# Patient Record
Sex: Male | Born: 1940 | Race: White | Hispanic: No | Marital: Single | Smoking: Former smoker
Health system: Southern US, Community
[De-identification: ages and names within clinical notes are randomized; demographics above are authoritative.]

## PROBLEM LIST (undated history)

## (undated) DIAGNOSIS — I719 Aortic aneurysm of unspecified site, without rupture: Secondary | ICD-10-CM

## (undated) DIAGNOSIS — E039 Hypothyroidism, unspecified: Secondary | ICD-10-CM

## (undated) DIAGNOSIS — M1990 Unspecified osteoarthritis, unspecified site: Secondary | ICD-10-CM

## (undated) DIAGNOSIS — I2510 Atherosclerotic heart disease of native coronary artery without angina pectoris: Secondary | ICD-10-CM

## (undated) DIAGNOSIS — G4730 Sleep apnea, unspecified: Secondary | ICD-10-CM

## (undated) DIAGNOSIS — K219 Gastro-esophageal reflux disease without esophagitis: Secondary | ICD-10-CM

## (undated) HISTORY — PX: ABDOMINAL AORTIC ANEURYSM REPAIR: SUR1152

## (undated) HISTORY — PX: HERNIA REPAIR: SHX51

## (undated) HISTORY — PX: BACK SURGERY: SHX140

---

## 2014-01-03 ENCOUNTER — Other Ambulatory Visit: Payer: Self-pay | Admitting: Orthopedic Surgery

## 2014-01-04 ENCOUNTER — Encounter (HOSPITAL_COMMUNITY): Payer: Self-pay | Admitting: Pharmacy Technician

## 2014-01-07 ENCOUNTER — Encounter (HOSPITAL_COMMUNITY)
Admission: RE | Admit: 2014-01-07 | Discharge: 2014-01-07 | Disposition: A | Discharge status: Home / Self Care | Payer: Medicare Other | Source: Ambulatory Visit | Attending: Orthopedic Surgery | Admitting: Orthopedic Surgery

## 2014-01-07 ENCOUNTER — Encounter (HOSPITAL_COMMUNITY): Payer: Self-pay

## 2014-01-07 DIAGNOSIS — Z7982 Long term (current) use of aspirin: Secondary | ICD-10-CM | POA: Insufficient documentation

## 2014-01-07 DIAGNOSIS — E039 Hypothyroidism, unspecified: Secondary | ICD-10-CM | POA: Diagnosis not present

## 2014-01-07 DIAGNOSIS — Z9889 Other specified postprocedural states: Secondary | ICD-10-CM | POA: Diagnosis not present

## 2014-01-07 DIAGNOSIS — I714 Abdominal aortic aneurysm, without rupture, unspecified: Secondary | ICD-10-CM | POA: Insufficient documentation

## 2014-01-07 DIAGNOSIS — K219 Gastro-esophageal reflux disease without esophagitis: Secondary | ICD-10-CM | POA: Insufficient documentation

## 2014-01-07 DIAGNOSIS — G4733 Obstructive sleep apnea (adult) (pediatric): Secondary | ICD-10-CM | POA: Diagnosis not present

## 2014-01-07 DIAGNOSIS — I2510 Atherosclerotic heart disease of native coronary artery without angina pectoris: Secondary | ICD-10-CM | POA: Insufficient documentation

## 2014-01-07 DIAGNOSIS — Z01818 Encounter for other preprocedural examination: Secondary | ICD-10-CM | POA: Diagnosis present

## 2014-01-07 DIAGNOSIS — M169 Osteoarthritis of hip, unspecified: Secondary | ICD-10-CM | POA: Diagnosis present

## 2014-01-07 DIAGNOSIS — M1610 Unilateral primary osteoarthritis, unspecified hip: Secondary | ICD-10-CM | POA: Diagnosis not present

## 2014-01-07 DIAGNOSIS — Z79899 Other long term (current) drug therapy: Secondary | ICD-10-CM | POA: Diagnosis not present

## 2014-01-07 HISTORY — DX: Hypothyroidism, unspecified: E03.9

## 2014-01-07 HISTORY — DX: Unspecified osteoarthritis, unspecified site: M19.90

## 2014-01-07 HISTORY — DX: Atherosclerotic heart disease of native coronary artery without angina pectoris: I25.10

## 2014-01-07 HISTORY — DX: Gastro-esophageal reflux disease without esophagitis: K21.9

## 2014-01-07 HISTORY — DX: Sleep apnea, unspecified: G47.30

## 2014-01-07 HISTORY — DX: Aortic aneurysm of unspecified site, without rupture: I71.9

## 2014-01-07 LAB — CBC WITH DIFFERENTIAL/PLATELET
Basophils Absolute: 0.1 K/uL (ref 0.0–0.1)
Basophils Relative: 1 % (ref 0–1)
Eosinophils Absolute: 0.1 K/uL (ref 0.0–0.7)
Eosinophils Relative: 1 % (ref 0–5)
HCT: 41.9 % (ref 39.0–52.0)
Hemoglobin: 14 g/dL (ref 13.0–17.0)
Lymphocytes Relative: 26 % (ref 12–46)
Lymphs Abs: 2.1 K/uL (ref 0.7–4.0)
MCH: 32.9 pg (ref 26.0–34.0)
MCHC: 33.4 g/dL (ref 30.0–36.0)
MCV: 98.4 fL (ref 78.0–100.0)
Monocytes Absolute: 0.8 K/uL (ref 0.1–1.0)
Monocytes Relative: 10 % (ref 3–12)
Neutro Abs: 5.1 K/uL (ref 1.7–7.7)
Neutrophils Relative %: 62 % (ref 43–77)
Platelets: 231 K/uL (ref 150–400)
RBC: 4.26 MIL/uL (ref 4.22–5.81)
RDW: 13.7 % (ref 11.5–15.5)
WBC: 8.2 K/uL (ref 4.0–10.5)

## 2014-01-07 LAB — URINALYSIS, ROUTINE W REFLEX MICROSCOPIC
Bilirubin Urine: NEGATIVE
Glucose, UA: NEGATIVE mg/dL
HGB URINE DIPSTICK: NEGATIVE
KETONES UR: 15 mg/dL — AB
Nitrite: NEGATIVE
Protein, ur: NEGATIVE mg/dL
Specific Gravity, Urine: 1.021 (ref 1.005–1.030)
Urobilinogen, UA: 0.2 mg/dL (ref 0.0–1.0)
pH: 7 (ref 5.0–8.0)

## 2014-01-07 LAB — COMPREHENSIVE METABOLIC PANEL WITH GFR
ALT: 12 U/L (ref 0–53)
AST: 17 U/L (ref 0–37)
Albumin: 3.7 g/dL (ref 3.5–5.2)
Alkaline Phosphatase: 86 U/L (ref 39–117)
Anion gap: 10 (ref 5–15)
BUN: 10 mg/dL (ref 6–23)
CO2: 29 meq/L (ref 19–32)
Calcium: 9.7 mg/dL (ref 8.4–10.5)
Chloride: 104 meq/L (ref 96–112)
Creatinine, Ser: 1.07 mg/dL (ref 0.50–1.35)
GFR calc Af Amer: 79 mL/min — ABNORMAL LOW
GFR calc non Af Amer: 68 mL/min — ABNORMAL LOW
Glucose, Bld: 100 mg/dL — ABNORMAL HIGH (ref 70–99)
Potassium: 4.3 meq/L (ref 3.7–5.3)
Sodium: 143 meq/L (ref 137–147)
Total Bilirubin: 0.5 mg/dL (ref 0.3–1.2)
Total Protein: 6.9 g/dL (ref 6.0–8.3)

## 2014-01-07 LAB — PROTIME-INR
INR: 1.08 (ref 0.00–1.49)
Prothrombin Time: 14 s (ref 11.6–15.2)

## 2014-01-07 LAB — URINE MICROSCOPIC-ADD ON

## 2014-01-07 LAB — SURGICAL PCR SCREEN
MRSA, PCR: NEGATIVE
Staphylococcus aureus: NEGATIVE

## 2014-01-07 LAB — ABO/RH: ABO/RH(D): A NEG

## 2014-01-07 LAB — APTT: aPTT: 29 s (ref 24–37)

## 2014-01-07 NOTE — Progress Notes (Signed)
Req'd ekg, cardiac tests, office notes from dr Alphonsus Sias at [facility] [phone]

## 2014-01-07 NOTE — Pre-Procedure Instructions (Addendum)
Billy Byrd  01/07/2014   Your procedure is scheduled on:  01/14/14  Report to [facility] short stay admitting at 48 AM.  Call this number if you have problems the morning of surgery: [phone]   Remember:   Do not eat food or drink liquids after midnight.   Take these medicines the morning of surgery with A SIP OF WATER: nexium, levothyroxine    Take all meds as ordered until day of surgery except as instructed below or per dr     Billy Byrd all herbel meds, nsaids (aleve,naproxen,advil,ibuprofen) 5 days prior to surgery(01/09/14) including vitamins ,aspirin  ,fish oil      STOP plavix per dr   Billy Byrd not wear jewelry, make-up or nail polish.  Do not wear lotions, powders, or perfumes. You may wear deodorant.  Do not shave 48 hours prior to surgery. Men may shave face and neck.  Do not bring valuables to the hospital.  [facility] is not responsible                  for any belongings or valuables.               Contacts, dentures or bridgework may not be worn into surgery.  Leave suitcase in the car. After surgery it may be brought to your room.  For patients admitted to the hospital, discharge time is determined by your                treatment team.               Patients discharged the day of surgery will not be allowed to drive  home.  Name and phone number of your driver:   Special Instructions:  Special Instructions: [place] - Preparing for Surgery  Before surgery, you can play an important role.  Because skin is not sterile, your skin needs to be as free of germs as possible.  You can reduce the number of germs on you skin by washing with CHG (chlorahexidine gluconate) soap before surgery.  CHG is an antiseptic cleaner which kills germs and bonds with the skin to continue killing germs even after washing.  Please DO NOT use if you have an allergy to CHG or antibacterial soaps.  If your skin becomes reddened/irritated stop using the CHG and inform your nurse when you arrive at  Short Stay.  Do not shave (including legs and underarms) for at least 48 hours prior to the first CHG shower.  You may shave your face.  Please follow these instructions carefully:   1.  Shower with CHG Soap the night before surgery and the morning of Surgery.  2.  If you choose to wash your hair, wash your hair first as usual with your normal shampoo.  3.  After you shampoo, rinse your hair and body thoroughly to remove the Shampoo.  4.  Use CHG as you would any other liquid soap.  You can apply chg directly  to the skin and wash gently with scrungie or a clean washcloth.  5.  Apply the CHG Soap to your body ONLY FROM THE NECK DOWN.  Do not use on open wounds or open sores.  Avoid contact with your eyes ears, mouth and genitals (private parts).  Wash genitals (private parts)       with your normal soap.  6.  Wash thoroughly, paying special attention to the area where your surgery will be performed.  7.  Thoroughly rinse your  body with warm water from the neck down.  8.  DO NOT shower/wash with your normal soap after using and rinsing off the CHG Soap.  9.  Pat yourself dry with a clean towel.            10.  Wear clean pajamas.            11.  Place clean sheets on your bed the night of your first shower and do not sleep with pets.  Day of Surgery  Do not apply any lotions/deodorants the morning of surgery.  Please wear clean clothes to the hospital/surgery center.   Please read over the following fact sheets that you were given: Pain Booklet, Coughing and Deep Breathing, Blood Transfusion Information, Total Joint Packet, MRSA Information and Surgical Site Infection Prevention

## 2014-01-08 NOTE — Progress Notes (Signed)
Anesthesia Chart Review:  Pt is 73 year old male posted for R total hip replacement on 01/14/14 with Dr. Dorna Leitz.   PMH: CAD (s/p DES to RCA 2005), OSA, GERD, hypothyroidism, AAA with repair (2010).   Medications include plavix and ASA.   Cxr shows no active disease, possible chronic mild interstitial prominence.   Preoperative labs reviewed.    EKG pending from [place]. On 02/2013, on stress test, resting EKG shows sinus rhythm with normal conduction.   Cardiology note located in "care everywhere" 11/13/13 from pt's cardiologist Dr. Odette Horns notes pt had negative nuclear stress test in 02/2013, EF 68%, and states pt is cleared for hip replacement surgery. Pt instructed to stop plavix and ASA 5 days prior to surgery and to resume 2-3 days post-op.   If EKG not received from [facility] by DOS, get same day EKG prior to surgery.   If no changes, I anticipate pt can proceed with surgery as scheduled.   Willeen Cass, FNP-BC [facility]/Anesthesiology Phone: [phone] 01/08/2014 4:00 PM

## 2014-01-13 MED ORDER — CHLORHEXIDINE GLUCONATE 4 % EX LIQD
60.0000 mL | Freq: Once | CUTANEOUS | Status: DC
  Filled 2014-01-13: qty 60

## 2014-01-13 MED ORDER — CEFAZOLIN SODIUM-DEXTROSE 2-3 GM-% IV SOLR
2.0000 g | INTRAVENOUS | Status: AC
  Administered 2014-01-14: 2 g via INTRAVENOUS
  Filled 2014-01-13: qty 50

## 2014-01-14 ENCOUNTER — Encounter (HOSPITAL_COMMUNITY)
Admission: RE | Disposition: A | Discharge status: Home Health | Payer: Medicare Other | Source: Ambulatory Visit | Attending: Orthopedic Surgery

## 2014-01-14 ENCOUNTER — Inpatient Hospital Stay (HOSPITAL_COMMUNITY)
Admission: RE | Admit: 2014-01-14 | Discharge: 2014-01-16 | DRG: 470 | Disposition: A | Discharge status: Home Health | Payer: Medicare Other | Source: Ambulatory Visit | Attending: Orthopedic Surgery | Admitting: Orthopedic Surgery

## 2014-01-14 ENCOUNTER — Inpatient Hospital Stay (HOSPITAL_COMMUNITY): Payer: Medicare Other | Admitting: Certified Registered Nurse Anesthetist

## 2014-01-14 ENCOUNTER — Encounter (HOSPITAL_COMMUNITY): Payer: Self-pay | Admitting: *Deleted

## 2014-01-14 ENCOUNTER — Encounter (HOSPITAL_COMMUNITY): Payer: Medicare Other | Admitting: Emergency Medicine

## 2014-01-14 ENCOUNTER — Inpatient Hospital Stay (HOSPITAL_COMMUNITY): Payer: Medicare Other

## 2014-01-14 DIAGNOSIS — M169 Osteoarthritis of hip, unspecified: Principal | ICD-10-CM | POA: Diagnosis present

## 2014-01-14 DIAGNOSIS — Z7902 Long term (current) use of antithrombotics/antiplatelets: Secondary | ICD-10-CM | POA: Diagnosis not present

## 2014-01-14 DIAGNOSIS — E039 Hypothyroidism, unspecified: Secondary | ICD-10-CM | POA: Diagnosis not present

## 2014-01-14 DIAGNOSIS — Z79899 Other long term (current) drug therapy: Secondary | ICD-10-CM

## 2014-01-14 DIAGNOSIS — Z7982 Long term (current) use of aspirin: Secondary | ICD-10-CM

## 2014-01-14 DIAGNOSIS — K219 Gastro-esophageal reflux disease without esophagitis: Secondary | ICD-10-CM | POA: Diagnosis present

## 2014-01-14 DIAGNOSIS — M1610 Unilateral primary osteoarthritis, unspecified hip: Secondary | ICD-10-CM | POA: Diagnosis not present

## 2014-01-14 DIAGNOSIS — G4730 Sleep apnea, unspecified: Secondary | ICD-10-CM | POA: Diagnosis present

## 2014-01-14 DIAGNOSIS — M25559 Pain in unspecified hip: Secondary | ICD-10-CM | POA: Diagnosis present

## 2014-01-14 DIAGNOSIS — I2510 Atherosclerotic heart disease of native coronary artery without angina pectoris: Secondary | ICD-10-CM | POA: Diagnosis not present

## 2014-01-14 DIAGNOSIS — M1611 Unilateral primary osteoarthritis, right hip: Secondary | ICD-10-CM | POA: Diagnosis present

## 2014-01-14 DIAGNOSIS — Z87891 Personal history of nicotine dependence: Secondary | ICD-10-CM | POA: Diagnosis not present

## 2014-01-14 HISTORY — PX: TOTAL HIP ARTHROPLASTY: SHX124

## 2014-01-14 LAB — TYPE AND SCREEN
ABO/RH(D): A NEG
ABO/RH(D): A NEG
Antibody Screen: NEGATIVE
Antibody Screen: NEGATIVE

## 2014-01-14 SURGERY — ARTHROPLASTY, HIP, TOTAL, ANTERIOR APPROACH
Anesthesia: Monitor Anesthesia Care | Site: Hip | Laterality: Right

## 2014-01-14 MED ORDER — HYDROMORPHONE HCL PF 1 MG/ML IJ SOLN
0.5000 mg | INTRAMUSCULAR | Status: DC | PRN
  Administered 2014-01-14 – 2014-01-15 (×4): 1 mg via INTRAVENOUS
  Filled 2014-01-14 (×4): qty 1

## 2014-01-14 MED ORDER — METHOCARBAMOL 500 MG PO TABS
500.0000 mg | ORAL_TABLET | Freq: Four times a day (QID) | ORAL | Status: DC | PRN
  Administered 2014-01-14 – 2014-01-16 (×2): 500 mg via ORAL
  Filled 2014-01-14 (×2): qty 1

## 2014-01-14 MED ORDER — SODIUM CHLORIDE 0.9 % IV SOLN
2000.0000 mg | INTRAVENOUS | Status: DC | PRN
  Administered 2014-01-14: 2000 mg via INTRAVENOUS

## 2014-01-14 MED ORDER — FENTANYL CITRATE 0.05 MG/ML IJ SOLN
INTRAMUSCULAR | Status: AC
  Filled 2014-01-14: qty 5

## 2014-01-14 MED ORDER — 0.9 % SODIUM CHLORIDE (POUR BTL) OPTIME
TOPICAL | Status: DC | PRN
  Administered 2014-01-14: 1000 mL

## 2014-01-14 MED ORDER — METHOCARBAMOL 1000 MG/10ML IJ SOLN
500.0000 mg | Freq: Four times a day (QID) | INTRAVENOUS | Status: DC | PRN
  Filled 2014-01-14: qty 5

## 2014-01-14 MED ORDER — BISACODYL 5 MG PO TBEC
5.0000 mg | DELAYED_RELEASE_TABLET | Freq: Every day | ORAL | Status: DC | PRN
  Administered 2014-01-15: 5 mg via ORAL
  Filled 2014-01-14: qty 1

## 2014-01-14 MED ORDER — KETOROLAC TROMETHAMINE 15 MG/ML IJ SOLN
INTRAMUSCULAR | Status: AC
  Filled 2014-01-14: qty 1

## 2014-01-14 MED ORDER — POLYETHYLENE GLYCOL 3350 17 G PO PACK: 17.0000 g | PACK | Freq: Every day | ORAL | Status: DC | PRN

## 2014-01-14 MED ORDER — ARTIFICIAL TEARS OP OINT
TOPICAL_OINTMENT | OPHTHALMIC | Status: AC
  Filled 2014-01-14: qty 3.5

## 2014-01-14 MED ORDER — OXYCODONE-ACETAMINOPHEN 5-325 MG PO TABS: 1.0000 | ORAL_TABLET | Freq: Four times a day (QID) | ORAL | Status: DC | PRN

## 2014-01-14 MED ORDER — ACETAMINOPHEN 650 MG RE SUPP
650.0000 mg | Freq: Four times a day (QID) | RECTAL | Status: DC | PRN
Start: 2014-01-14 — End: 2014-01-16

## 2014-01-14 MED ORDER — OXYCODONE HCL 5 MG/5ML PO SOLN: 5.0000 mg | Freq: Once | ORAL | Status: DC | PRN

## 2014-01-14 MED ORDER — SODIUM CHLORIDE 0.9 % IV SOLN
INTRAVENOUS | Status: DC | PRN
  Administered 2014-01-14: 13:00:00 via INTRAVENOUS

## 2014-01-14 MED ORDER — SODIUM CHLORIDE 0.9 % IV SOLN
INTRAVENOUS | Status: DC
  Administered 2014-01-14: 18:00:00 via INTRAVENOUS

## 2014-01-14 MED ORDER — LEVOTHYROXINE SODIUM 150 MCG PO TABS
150.0000 ug | ORAL_TABLET | ORAL | Status: DC
  Administered 2014-01-15: 150 ug via ORAL
  Filled 2014-01-14 (×2): qty 1

## 2014-01-14 MED ORDER — BUPIVACAINE HCL (PF) 0.5 % IJ SOLN
INTRAMUSCULAR | Status: DC | PRN
  Administered 2014-01-14: 20 mL

## 2014-01-14 MED ORDER — ONDANSETRON HCL 4 MG/2ML IJ SOLN: 4.0000 mg | Freq: Four times a day (QID) | INTRAMUSCULAR | Status: DC | PRN

## 2014-01-14 MED ORDER — KETOROLAC TROMETHAMINE 15 MG/ML IJ SOLN
7.5000 mg | Freq: Four times a day (QID) | INTRAMUSCULAR | Status: AC
  Administered 2014-01-14 – 2014-01-15 (×4): 7.5 mg via INTRAVENOUS
  Filled 2014-01-14 (×2): qty 1

## 2014-01-14 MED ORDER — BUPIVACAINE IN DEXTROSE 0.75-8.25 % IT SOLN
INTRATHECAL | Status: DC | PRN
  Administered 2014-01-14: 12 mg via INTRATHECAL

## 2014-01-14 MED ORDER — LACTATED RINGERS IV SOLN
INTRAVENOUS | Status: DC
  Administered 2014-01-14: 11:00:00 via INTRAVENOUS

## 2014-01-14 MED ORDER — TRANEXAMIC ACID 100 MG/ML IV SOLN
2000.0000 mg | INTRAVENOUS | Status: DC
  Filled 2014-01-14: qty 20

## 2014-01-14 MED ORDER — ALUM & MAG HYDROXIDE-SIMETH 200-200-20 MG/5ML PO SUSP: 30.0000 mL | ORAL | Status: DC | PRN

## 2014-01-14 MED ORDER — LIDOCAINE HCL (CARDIAC) 20 MG/ML IV SOLN
INTRAVENOUS | Status: DC | PRN
Start: 2014-01-14 — End: 2014-01-14
  Administered 2014-01-14: 20 mg via INTRAVENOUS

## 2014-01-14 MED ORDER — PROPOFOL INFUSION 10 MG/ML OPTIME
INTRAVENOUS | Status: DC | PRN
  Administered 2014-01-14: 13:00:00 via INTRAVENOUS
  Administered 2014-01-14: 50 ug/kg/min via INTRAVENOUS

## 2014-01-14 MED ORDER — PROPOFOL 10 MG/ML IV BOLUS
INTRAVENOUS | Status: AC
  Filled 2014-01-14: qty 20

## 2014-01-14 MED ORDER — PROPOFOL 10 MG/ML IV BOLUS
INTRAVENOUS | Status: DC | PRN
  Administered 2014-01-14 (×2): 30 mg via INTRAVENOUS

## 2014-01-14 MED ORDER — LEVOTHYROXINE SODIUM 175 MCG PO TABS
175.0000 ug | ORAL_TABLET | ORAL | Status: DC
  Administered 2014-01-16: 175 ug via ORAL
  Filled 2014-01-14: qty 1

## 2014-01-14 MED ORDER — CLOPIDOGREL BISULFATE 75 MG PO TABS
75.0000 mg | ORAL_TABLET | Freq: Every day | ORAL | Status: DC
  Administered 2014-01-15 – 2014-01-16 (×2): 75 mg via ORAL
  Filled 2014-01-14 (×2): qty 1

## 2014-01-14 MED ORDER — BUPIVACAINE LIPOSOME 1.3 % IJ SUSP
INTRAMUSCULAR | Status: DC | PRN
  Administered 2014-01-14: 20 mL

## 2014-01-14 MED ORDER — ACETAMINOPHEN 325 MG PO TABS
650.0000 mg | ORAL_TABLET | Freq: Four times a day (QID) | ORAL | Status: DC | PRN
Start: 2014-01-14 — End: 2014-01-16

## 2014-01-14 MED ORDER — FENTANYL CITRATE 0.05 MG/ML IJ SOLN
INTRAMUSCULAR | Status: DC | PRN
  Administered 2014-01-14: 50 ug via INTRAVENOUS
  Administered 2014-01-14: 100 ug via INTRAVENOUS

## 2014-01-14 MED ORDER — MIDAZOLAM HCL 5 MG/5ML IJ SOLN
INTRAMUSCULAR | Status: DC | PRN
  Administered 2014-01-14: 2 mg via INTRAVENOUS

## 2014-01-14 MED ORDER — PROMETHAZINE HCL 25 MG/ML IJ SOLN: 12.5000 mg | Freq: Four times a day (QID) | INTRAMUSCULAR | Status: DC | PRN

## 2014-01-14 MED ORDER — ONDANSETRON HCL 4 MG/2ML IJ SOLN
INTRAMUSCULAR | Status: DC | PRN
  Administered 2014-01-14: 4 mg via INTRAVENOUS

## 2014-01-14 MED ORDER — ONDANSETRON HCL 4 MG PO TABS
4.0000 mg | ORAL_TABLET | Freq: Four times a day (QID) | ORAL | Status: DC | PRN
Start: 2014-01-14 — End: 2014-01-16

## 2014-01-14 MED ORDER — PROMETHAZINE HCL 25 MG/ML IJ SOLN: 6.2500 mg | INTRAMUSCULAR | Status: DC | PRN

## 2014-01-14 MED ORDER — DOCUSATE SODIUM 100 MG PO CAPS
100.0000 mg | ORAL_CAPSULE | Freq: Two times a day (BID) | ORAL | Status: DC
  Administered 2014-01-14 – 2014-01-16 (×4): 100 mg via ORAL
  Filled 2014-01-14 (×6): qty 1

## 2014-01-14 MED ORDER — OXYCODONE-ACETAMINOPHEN 5-325 MG PO TABS
1.0000 | ORAL_TABLET | ORAL | Status: DC | PRN
  Administered 2014-01-14 – 2014-01-16 (×8): 2 via ORAL
  Filled 2014-01-14 (×8): qty 2

## 2014-01-14 MED ORDER — CEFAZOLIN SODIUM-DEXTROSE 2-3 GM-% IV SOLR
2.0000 g | Freq: Four times a day (QID) | INTRAVENOUS | Status: AC
  Administered 2014-01-14 (×2): 2 g via INTRAVENOUS
  Filled 2014-01-14 (×2): qty 50

## 2014-01-14 MED ORDER — ASPIRIN EC 81 MG PO TBEC
81.0000 mg | DELAYED_RELEASE_TABLET | Freq: Two times a day (BID) | ORAL | Status: DC
  Administered 2014-01-14 – 2014-01-16 (×4): 81 mg via ORAL
  Filled 2014-01-14 (×10): qty 1

## 2014-01-14 MED ORDER — OXYCODONE HCL 5 MG PO TABS: 5.0000 mg | ORAL_TABLET | Freq: Once | ORAL | Status: DC | PRN

## 2014-01-14 MED ORDER — HYDROMORPHONE HCL PF 1 MG/ML IJ SOLN: 0.2500 mg | INTRAMUSCULAR | Status: DC | PRN

## 2014-01-14 MED ORDER — BUPIVACAINE LIPOSOME 1.3 % IJ SUSP
20.0000 mL | INTRAMUSCULAR | Status: DC
  Filled 2014-01-14: qty 20

## 2014-01-14 MED ORDER — ZOLPIDEM TARTRATE 5 MG PO TABS: 5.0000 mg | ORAL_TABLET | Freq: Every evening | ORAL | Status: DC | PRN

## 2014-01-14 MED ORDER — MIDAZOLAM HCL 2 MG/2ML IJ SOLN
INTRAMUSCULAR | Status: AC
  Filled 2014-01-14: qty 2

## 2014-01-14 MED ORDER — PHENYLEPHRINE HCL 10 MG/ML IJ SOLN
10.0000 mg | INTRAVENOUS | Status: DC | PRN
  Administered 2014-01-14: 20 ug/min via INTRAVENOUS

## 2014-01-14 MED ORDER — ONDANSETRON HCL 4 MG/2ML IJ SOLN
INTRAMUSCULAR | Status: AC
  Filled 2014-01-14: qty 2

## 2014-01-14 MED ORDER — DIPHENHYDRAMINE HCL 12.5 MG/5ML PO ELIX: 12.5000 mg | ORAL_SOLUTION | ORAL | Status: DC | PRN

## 2014-01-14 MED ORDER — PANTOPRAZOLE SODIUM 40 MG PO TBEC
80.0000 mg | DELAYED_RELEASE_TABLET | Freq: Every day | ORAL | Status: DC
  Administered 2014-01-15: 80 mg via ORAL
  Filled 2014-01-14: qty 2

## 2014-01-14 SURGICAL SUPPLY — 54 items
BENZOIN TINCTURE PRP APPL 2/3 (GAUZE/BANDAGES/DRESSINGS) ×3 IMPLANT
BLADE SAW SGTL 18X1.27X75 (BLADE) ×2 IMPLANT
BLADE SAW SGTL 18X1.27X75MM (BLADE) ×1
BLADE SURG ROTATE 9660 (MISCELLANEOUS) IMPLANT
BNDG COHESIVE 6X5 TAN STRL LF (GAUZE/BANDAGES/DRESSINGS) IMPLANT
BNDG GAUZE ELAST 4 BULKY (GAUZE/BANDAGES/DRESSINGS) IMPLANT
CAPT HIP PF MOP ×3 IMPLANT
CELLS DAT CNTRL 66122 CELL SVR (MISCELLANEOUS) ×1 IMPLANT
CLOSURE STERI-STRIP 1/2X4 (GAUZE/BANDAGES/DRESSINGS) ×1
CLSR STERI-STRIP ANTIMIC 1/2X4 (GAUZE/BANDAGES/DRESSINGS) ×2 IMPLANT
COVER SURGICAL LIGHT HANDLE (MISCELLANEOUS) ×3 IMPLANT
DRAPE C-ARM 42X72 X-RAY (DRAPES) ×3 IMPLANT
DRAPE STERI IOBAN 125X83 (DRAPES) ×3 IMPLANT
DRAPE U-SHAPE 47X51 STRL (DRAPES) ×9 IMPLANT
DRSG MEPILEX BORDER 4X8 (GAUZE/BANDAGES/DRESSINGS) ×3 IMPLANT
DURAPREP 26ML APPLICATOR (WOUND CARE) ×3 IMPLANT
ELECT BLADE 4.0 EZ CLEAN MEGAD (MISCELLANEOUS)
ELECT CAUTERY BLADE 6.4 (BLADE) ×3 IMPLANT
ELECT REM PT RETURN 9FT ADLT (ELECTROSURGICAL) ×3
ELECTRODE BLDE 4.0 EZ CLN MEGD (MISCELLANEOUS) IMPLANT
ELECTRODE REM PT RTRN 9FT ADLT (ELECTROSURGICAL) ×1 IMPLANT
GAUZE XEROFORM 1X8 LF (GAUZE/BANDAGES/DRESSINGS) IMPLANT
GLOVE BIOGEL PI IND STRL 8 (GLOVE) ×2 IMPLANT
GLOVE BIOGEL PI INDICATOR 8 (GLOVE) ×4
GLOVE ECLIPSE 7.5 STRL STRAW (GLOVE) ×6 IMPLANT
GOWN STRL REUS W/ TWL LRG LVL3 (GOWN DISPOSABLE) ×2 IMPLANT
GOWN STRL REUS W/ TWL XL LVL3 (GOWN DISPOSABLE) ×2 IMPLANT
GOWN STRL REUS W/TWL LRG LVL3 (GOWN DISPOSABLE) ×4
GOWN STRL REUS W/TWL XL LVL3 (GOWN DISPOSABLE) ×4
HOOD PEEL AWAY FACE SHEILD DIS (HOOD) ×6 IMPLANT
KIT BASIN OR (CUSTOM PROCEDURE TRAY) ×3 IMPLANT
KIT ROOM TURNOVER OR (KITS) ×3 IMPLANT
MANIFOLD NEPTUNE II (INSTRUMENTS) ×3 IMPLANT
NEEDLE 22X1 1/2 (OR ONLY) (NEEDLE) ×3 IMPLANT
NS IRRIG 1000ML POUR BTL (IV SOLUTION) ×3 IMPLANT
PACK TOTAL JOINT (CUSTOM PROCEDURE TRAY) ×3 IMPLANT
PAD ARMBOARD 7.5X6 YLW CONV (MISCELLANEOUS) ×6 IMPLANT
RTRCTR WOUND ALEXIS 18CM MED (MISCELLANEOUS) ×3
SPONGE LAP 18X18 X RAY DECT (DISPOSABLE) IMPLANT
STAPLER VISISTAT 35W (STAPLE) IMPLANT
SUT ETHIBOND NAB CT1 #1 30IN (SUTURE) ×6 IMPLANT
SUT MNCRL AB 3-0 PS2 18 (SUTURE) IMPLANT
SUT VIC AB 0 CT1 27 (SUTURE) ×2
SUT VIC AB 0 CT1 27XBRD ANBCTR (SUTURE) ×1 IMPLANT
SUT VIC AB 1 CT1 27 (SUTURE) ×8
SUT VIC AB 1 CT1 27XBRD ANBCTR (SUTURE) ×4 IMPLANT
SUT VIC AB 2-0 CT1 27 (SUTURE) ×2
SUT VIC AB 2-0 CT1 TAPERPNT 27 (SUTURE) ×1 IMPLANT
SYRINGE 60CC LL (MISCELLANEOUS) ×3 IMPLANT
SYRINGE IRR TOOMEY STRL 70CC (SYRINGE) ×3 IMPLANT
TOWEL OR 17X24 6PK STRL BLUE (TOWEL DISPOSABLE) ×3 IMPLANT
TOWEL OR 17X26 10 PK STRL BLUE (TOWEL DISPOSABLE) ×3 IMPLANT
TRAY FOLEY CATH 16FR SILVER (SET/KITS/TRAYS/PACK) ×3 IMPLANT
WATER STERILE IRR 1000ML POUR (IV SOLUTION) IMPLANT

## 2014-01-14 NOTE — H&P (Signed)
TOTAL HIP ADMISSION H&P  Patient is admitted for right total hip arthroplasty.  Subjective:  Chief Complaint: right hip pain  HPI: Billy Byrd, 73 y.o. male, has a history of pain and functional disability in the right hip(s) due to arthritis and patient has failed non-surgical conservative treatments for greater than 12 weeks to include supervised PT with diminished ADL's post treatment, use of assistive devices, weight reduction as appropriate and activity modification.  Onset of symptoms was gradual starting 3 years ago with gradually worsening course since that time.The patient noted no past surgery on the right hip(s).  Patient currently rates pain in the right hip at 8 out of 10 with activity. Patient has night pain, worsening of pain with activity and weight bearing, trendelenberg gait, pain that interfers with activities of daily living, pain with passive range of motion and crepitus. Patient has evidence of subchondral cysts, subchondral sclerosis and joint space narrowing by imaging studies. This condition presents safety issues increasing the risk of falls. This patient has had failure of all reasonable conservative care.  There is no current active infection.  There are no active problems to display for this patient.  Past Medical History  Diagnosis Date  . Coronary artery disease   . Aortic aneurysm     stent done 6 yrs ago  . Sleep apnea     cpap used since 04  . Hypothyroidism   . GERD (gastroesophageal reflux disease)   . Arthritis     Past Surgical History  Procedure Laterality Date  . Abdominal aortic aneurysm repair      6 yrs ago stent  . Back surgery  84,10  . Hernia repair      bilateral inguinal    Prescriptions prior to admission  Medication Sig Dispense Refill  . clopidogrel (PLAVIX) 75 MG tablet Take 75 mg by mouth daily.      . cyanocobalamin (,VITAMIN B-12,) 1000 MCG/ML injection Inject 1,000 mcg into the muscle every 30 (thirty) days. First of each  month      . esomeprazole (NEXIUM) 40 MG capsule Take 40 mg by mouth daily at 12 noon.      Marland Kitchen ibuprofen (ADVIL,MOTRIN) 200 MG tablet Take 800 mg by mouth every 6 (six) hours as needed.      Marland Kitchen levothyroxine (SYNTHROID, LEVOTHROID) 150 MCG tablet Take 150 mcg by mouth every other day. Take 150 mcg every other day, alternating with 175 mcg      . levothyroxine (SYNTHROID, LEVOTHROID) 175 MCG tablet Take 175 mcg by mouth every other day. Take 175 mcg every other day alternating with 150 mcg      . aspirin 81 MG tablet Take 81 mg by mouth daily.      . calcium carbonate 200 MG capsule Take 1,000 mg by mouth 1 day or 1 dose.      . Calcium Carbonate-Vitamin D (CALCIUM-VITAMIN D) 500-200 MG-UNIT per tablet Take 1 tablet by mouth daily.      . Fish Oil-Cholecalciferol (FISH OIL + D3) 1000-1000 MG-UNIT CAPS Take 1,000 Units by mouth 1 day or 1 dose.       No Known Allergies  History  Substance Use Topics  . Smoking status: Former Smoker -- 0.50 packs/day for 10 years    Types: Cigarettes    Quit date: 01/07/2010  . Smokeless tobacco: Not on file  . Alcohol Use: Yes     Comment: occ wine    History reviewed. No pertinent family history.  ROS ROS: I have reviewed the patient's review of systems thoroughly and there are no positive responses as relates to the HPI. Objective:  Physical Exam  Vital signs in last 24 hours: Temp:  [97.1 F (36.2 C)] 97.1 F (36.2 C) (08/24 1033) Pulse Rate:  [75] 75 (08/24 1033) Resp:  [18] 18 (08/24 1033) BP: (145)/(74) 145/74 mmHg (08/24 1033) SpO2:  [98 %] 98 % (08/24 1033) Weight:  [227 lb 15.3 oz (103.4 kg)] 227 lb 15.3 oz (103.4 kg) (08/24 1033) Well-developed well-nourished patient in no acute distress. Alert and oriented x3 HEENT:within normal limits Cardiac: Regular rate and rhythm Pulmonary: Lungs clear to auscultation Abdomen: Soft and nontender.  Normal active bowel sounds  Musculoskeletal: Right hip has limited range of motion.  There is  pain with internal/external rotation. Labs: Recent Results (from the past 2160 hour(s))  SURGICAL PCR SCREEN     Status: None   Collection Time    01/07/14 12:26 PM      Result Value Ref Range   MRSA, PCR NEGATIVE  NEGATIVE   Staphylococcus aureus NEGATIVE  NEGATIVE   Comment:            The Xpert SA Assay (FDA     approved for NASAL specimens     in patients over 74 years of age),     is one component of     a comprehensive surveillance     program.  Test performance has     been validated by [employer] for patients greater     than or equal to 12 year old.     It is not intended     to diagnose infection nor to     guide or monitor treatment.  APTT     Status: None   Collection Time    01/07/14 12:26 PM      Result Value Ref Range   aPTT 29  24 - 37 seconds  CBC WITH DIFFERENTIAL     Status: None   Collection Time    01/07/14 12:26 PM      Result Value Ref Range   WBC 8.2  4.0 - 10.5 K/uL   RBC 4.26  4.22 - 5.81 MIL/uL   Hemoglobin 14.0  13.0 - 17.0 g/dL   HCT 41.9  39.0 - 52.0 %   MCV 98.4  78.0 - 100.0 fL   MCH 32.9  26.0 - 34.0 pg   MCHC 33.4  30.0 - 36.0 g/dL   RDW 13.7  11.5 - 15.5 %   Platelets 231  150 - 400 K/uL   Neutrophils Relative % 62  43 - 77 %   Neutro Abs 5.1  1.7 - 7.7 K/uL   Lymphocytes Relative 26  12 - 46 %   Lymphs Abs 2.1  0.7 - 4.0 K/uL   Monocytes Relative 10  3 - 12 %   Monocytes Absolute 0.8  0.1 - 1.0 K/uL   Eosinophils Relative 1  0 - 5 %   Eosinophils Absolute 0.1  0.0 - 0.7 K/uL   Basophils Relative 1  0 - 1 %   Basophils Absolute 0.1  0.0 - 0.1 K/uL  COMPREHENSIVE METABOLIC PANEL     Status: Abnormal   Collection Time    01/07/14 12:26 PM      Result Value Ref Range   Sodium 143  137 - 147 mEq/L   Potassium 4.3  3.7 - 5.3 mEq/L   Chloride  104  96 - 112 mEq/L   CO2 29  19 - 32 mEq/L   Glucose, Bld 100 (*) 70 - 99 mg/dL   BUN 10  6 - 23 mg/dL   Creatinine, Ser 1.07  0.50 - 1.35 mg/dL   Calcium 9.7  8.4 - 10.5 mg/dL    Total Protein 6.9  6.0 - 8.3 g/dL   Albumin 3.7  3.5 - 5.2 g/dL   AST 17  0 - 37 U/L   ALT 12  0 - 53 U/L   Alkaline Phosphatase 86  39 - 117 U/L   Total Bilirubin 0.5  0.3 - 1.2 mg/dL   GFR calc non Af Amer 68 (*) >90 mL/min   GFR calc Af Amer 79 (*) >90 mL/min   Comment: (NOTE)     The eGFR has been calculated using the CKD EPI equation.     This calculation has not been validated in all clinical situations.     eGFR's persistently <90 mL/min signify possible Chronic Kidney     Disease.   Anion gap 10  5 - 15  PROTIME-INR     Status: None   Collection Time    01/07/14 12:26 PM      Result Value Ref Range   Prothrombin Time 14.0  11.6 - 15.2 seconds   INR 1.08  0.00 - 1.49  URINALYSIS, ROUTINE W REFLEX MICROSCOPIC     Status: Abnormal   Collection Time    01/07/14 12:26 PM      Result Value Ref Range   Color, Urine YELLOW  YELLOW   APPearance CLEAR  CLEAR   Specific Gravity, Urine 1.021  1.005 - 1.030   pH 7.0  5.0 - 8.0   Glucose, UA NEGATIVE  NEGATIVE mg/dL   Hgb urine dipstick NEGATIVE  NEGATIVE   Bilirubin Urine NEGATIVE  NEGATIVE   Ketones, ur 15 (*) NEGATIVE mg/dL   Protein, ur NEGATIVE  NEGATIVE mg/dL   Urobilinogen, UA 0.2  0.0 - 1.0 mg/dL   Nitrite NEGATIVE  NEGATIVE   Leukocytes, UA TRACE (*) NEGATIVE  URINE MICROSCOPIC-ADD ON     Status: Abnormal   Collection Time    01/07/14 12:26 PM      Result Value Ref Range   Squamous Epithelial / LPF FEW (*) RARE   WBC, UA 0-2  <3 WBC/hpf   Bacteria, UA RARE  RARE  TYPE AND SCREEN     Status: None   Collection Time    01/07/14 12:35 PM      Result Value Ref Range   ABO/RH(D) A NEG     Antibody Screen NEG     Sample Expiration 01/21/2014    ABO/RH     Status: None   Collection Time    01/07/14 12:35 PM      Result Value Ref Range   ABO/RH(D) A NEG      Estimated body mass index is 27.03 kg/(m^2) as calculated from the following:   Height as of this encounter: 6' 5" (1.956 m).   Weight as of this encounter:  227 lb 15.3 oz (103.4 kg).   Imaging Review Plain radiographs demonstrate severe degenerative joint disease of the right hip(s). The bone quality appears to be good for age and reported activity level.  Assessment/Plan:  End stage arthritis, right hip(s)  The patient history, physical examination, clinical judgement of the provider and imaging studies are consistent with end stage degenerative joint disease of the right hip(s) and total  hip arthroplasty is deemed medically necessary. The treatment options including medical management, injection therapy, arthroscopy and arthroplasty were discussed at length. The risks and benefits of total hip arthroplasty were presented and reviewed. The risks due to aseptic loosening, infection, stiffness, dislocation/subluxation,  thromboembolic complications and other imponderables were discussed.  The patient acknowledged the explanation, agreed to proceed with the plan and consent was signed. Patient is being admitted for inpatient treatment for surgery, pain control, PT, OT, prophylactic antibiotics, VTE prophylaxis, progressive ambulation and ADL's and discharge planning.The patient is planning to be discharged home with home health services

## 2014-01-14 NOTE — Discharge Instructions (Signed)
Total Hip Replacement, Care After °Refer to this sheet in the next few weeks. These instructions provide you with information on caring for yourself after your procedure. Your health care provider may also give you specific instructions. Your treatment has been planned according to the most current medical practices, but problems sometimes occur. Call your health care provider if you have any problems or questions after your procedure. °HOME CARE INSTRUCTIONS  °Your health care provider will give you specific precautions for certain types of movement. Additional instructions include: °· Take medicines only as directed by your health care provider. °· Take quick showers (3-5 min) rather than bathe until your health care provider tells you that you can take baths again. °· Avoid lifting until your health care provider instructs you otherwise. °· Use a raised toilet seat and avoid sitting in low chairs as instructed by your health care provider. °· Use crutches or a walker as instructed by your health care provider. °SEEK MEDICAL CARE IF: °· You have difficulty breathing. °· You have drainage, redness, or swelling at your incision site. °· You have a bad smell coming from your incision site. °· You have persistent bleeding from your incision site. °· Your incision breaks open after sutures (stitches) or staples have been removed. °· You have a fever. °SEEK IMMEDIATE MEDICAL CARE IF:  °· You have a rash. °· You have pain or swelling in your calf or thigh. °· You have shortness of breath or chest pain. °MAKE SURE YOU: °· Understand these instructions. °· Will watch your condition. °· Will get help if you are not doing well or get worse. °Document Released: 11/27/2004 Document Revised: 09/24/2013 Document Reviewed: 07/11/2013 °ExitCare® Patient Information ©2015 [employer]. This information is not intended to replace advice given to you by your health care provider. Make sure you discuss any questions you have with  your health care provider. ° °

## 2014-01-14 NOTE — Anesthesia Procedure Notes (Signed)
Spinal  Patient location during procedure: OR Start time: 01/14/2014 11:47 AM End time: 01/14/2014 11:58 AM Staffing Anesthesiologist: Duane Boston Performed by: anesthesiologist  Preanesthetic Checklist Completed: patient identified, surgical consent, pre-op evaluation, timeout performed, IV checked, risks and benefits discussed and monitors and equipment checked Spinal Block Patient position: sitting Prep: Betadine Patient monitoring: cardiac monitor, continuous pulse ox and blood pressure Approach: midline Injection technique: single-shot Needle Needle type: Quincke and Pencil-Tip  Needle gauge: 24 G Needle length: 9 cm Additional Notes Functioning IV was confirmed and monitors were applied. Sterile prep and drape, including hand hygiene and sterile gloves were used. The patient was positioned and the spine was prepped. The skin was anesthetized with lidocaine.  Free flow of clear CSF was obtained prior to injecting local anesthetic into the CSF.  The spinal needle aspirated freely following injection.  The needle was carefully withdrawn.  The patient tolerated the procedure well.

## 2014-01-14 NOTE — Plan of Care (Signed)
Problem: Consults Goal: Diagnosis- Total Joint Replacement Primary Total Hip Right     

## 2014-01-14 NOTE — Transfer of Care (Signed)
Immediate Anesthesia Transfer of Care Note  Patient: Billy Byrd  Procedure(s) Performed: Procedure(s): RIGHT TOTAL HIP ARTHROPLASTY ANTERIOR APPROACH (Right)  Patient Location: PACU  Anesthesia Type:MAC and Spinal  Level of Consciousness: awake, alert  and oriented  Airway & Oxygen Therapy: Patient Spontanous Breathing and Patient connected to face mask oxygen  Post-op Assessment: Report given to PACU RN and Post -op Vital signs reviewed and stable  Post vital signs: Reviewed and stable  Complications: No apparent anesthesia complications

## 2014-01-14 NOTE — Brief Op Note (Signed)
01/14/2014  2:33 PM  PATIENT:  Roselyn Bering  73 y.o. male  PRE-OPERATIVE DIAGNOSIS:  DEGENERATIVE JOINT DISEASE RIGHT HIP  POST-OPERATIVE DIAGNOSIS:  DEGENERATIVE JOINT DISEASE RIGHT HIP  PROCEDURE:  Procedure(s): RIGHT TOTAL HIP ARTHROPLASTY ANTERIOR APPROACH (Right)  SURGEON:  Surgeon(s) and Role:    * Alta Corning, MD - Primary  PHYSICIAN ASSISTANT:   ASSISTANTS: bethune   ANESTHESIA:   spinal  EBL:  Total I/O In: 1000 [I.V.:1000] Out: 650 [Urine:150; Blood:500]  BLOOD ADMINISTERED:none  DRAINS: none   LOCAL MEDICATIONS USED:  OTHER experel  SPECIMEN:  No Specimen  DISPOSITION OF SPECIMEN:  N/A  COUNTS:  YES  TOURNIQUET:  * No tourniquets in log *  DICTATION: .Other Dictation: Dictation Number [id number]  PLAN OF CARE: Admit to inpatient   PATIENT DISPOSITION:  PACU - hemodynamically stable.   Delay start of Pharmacological VTE agent (>24hrs) due to surgical blood loss or risk of bleeding: no

## 2014-01-14 NOTE — Anesthesia Preprocedure Evaluation (Signed)
Anesthesia Evaluation    Reviewed: Allergy & Precautions, H&P , NPO status , Patient's Chart, lab work & pertinent test results  History of Anesthesia Complications Negative for: history of anesthetic complications  Airway       Dental   Pulmonary sleep apnea and Continuous Positive Airway Pressure Ventilation , former smoker,          Cardiovascular + CAD and + Peripheral Vascular Disease     Neuro/Psych negative neurological ROS  negative psych ROS   GI/Hepatic Neg liver ROS, GERD-  Medicated,  Endo/Other  Hypothyroidism   Renal/GU negative Renal ROS     Musculoskeletal   Abdominal   Peds  Hematology   Anesthesia Other Findings   Reproductive/Obstetrics                           Anesthesia Physical Anesthesia Plan  ASA: III  Anesthesia Plan: MAC and Spinal   Post-op Pain Management:    Induction:   Airway Management Planned: Simple Face Mask  Additional Equipment:   Intra-op Plan:   Post-operative Plan:   Informed Consent:   Plan Discussed with: CRNA, Anesthesiologist and Surgeon  Anesthesia Plan Comments:         Anesthesia Quick Evaluation

## 2014-01-15 ENCOUNTER — Encounter (HOSPITAL_COMMUNITY): Payer: Self-pay | Admitting: Orthopedic Surgery

## 2014-01-15 LAB — BASIC METABOLIC PANEL
Anion gap: 8 (ref 5–15)
BUN: 9 mg/dL (ref 6–23)
CO2: 29 meq/L (ref 19–32)
Calcium: 8.4 mg/dL (ref 8.4–10.5)
Chloride: 101 mEq/L (ref 96–112)
Creatinine, Ser: 0.9 mg/dL (ref 0.50–1.35)
GFR calc Af Amer: >90 mL/min (ref >90)
GFR, EST NON AFRICAN AMERICAN: 84 mL/min — AB (ref >90)
GLUCOSE: 96 mg/dL (ref 70–99)
POTASSIUM: 4.2 meq/L (ref 3.7–5.3)
Sodium: 138 mEq/L (ref 137–147)

## 2014-01-15 LAB — CBC
HCT: 32.3 % — ABNORMAL LOW (ref 39.0–52.0)
HEMOGLOBIN: 11 g/dL — AB (ref 13.0–17.0)
MCH: 33 pg (ref 26.0–34.0)
MCHC: 34.1 g/dL (ref 30.0–36.0)
MCV: 97 fL (ref 78.0–100.0)
PLATELETS: 167 10*3/uL (ref 150–400)
RBC: 3.33 MIL/uL — ABNORMAL LOW (ref 4.22–5.81)
RDW: 13.9 % (ref 11.5–15.5)
WBC: 8 10*3/uL (ref 4.0–10.5)

## 2014-01-15 MED ORDER — BOOST PLUS PO LIQD
237.0000 mL | Freq: Two times a day (BID) | ORAL | Status: DC
  Administered 2014-01-15 – 2014-01-16 (×2): via ORAL
  Filled 2014-01-15 (×6): qty 237

## 2014-01-15 NOTE — Anesthesia Postprocedure Evaluation (Signed)
  Anesthesia Post-op Note  Patient: Billy Byrd  Procedure(s) Performed: Procedure(s): RIGHT TOTAL HIP ARTHROPLASTY ANTERIOR APPROACH (Right)  Patient Location: PACU  Anesthesia Type:Spinal  Level of Consciousness: awake, alert  and oriented  Airway and Oxygen Therapy: Patient Spontanous Breathing  Post-op Pain: none  Post-op Assessment: Post-op Vital signs reviewed, Patient's Cardiovascular Status Stable and Respiratory Function Stable  Post-op Vital Signs: Reviewed and stable  Last Vitals:  Filed Vitals:   01/15/14 0616  BP: 119/59  Pulse: 75  Temp: 36.5 C  Resp:     Complications: No apparent anesthesia complications

## 2014-01-15 NOTE — Care Management Note (Signed)
CARE MANAGEMENT NOTE 01/15/2014  Patient:  Billy Byrd, Billy Byrd   Account Number:  [id number]  Date Initiated:  01/15/2014  Documentation initiated by:  Ricki Miller  Subjective/Objective Assessment:   73 yr old male s/p right total hip anterior approach.     Action/Plan:   Case manager spoke with patient and significant other concerning home health and DME needs at discharge. Choice offered. Patient lives in [place], [place].   Anticipated DC Date:  01/16/2014   Anticipated DC Plan:  [place]  In-house referral  Clinical Social Worker      DC Planning Services  CM consult      [facility] - Inpatient Choice  HOME HEALTH  DURABLE MEDICAL EQUIPMENT   Choice offered to / List presented to:  C-1 Patient   DME arranged  [place]  3-N-1      DME agency  [place] arranged  [place]   Status of service:  In process, will continue to follow Medicare Important Message given?  NA - LOS <3 / Initial given by admissions (If response is "NO", the following Medicare IM given date fields will be blank) Date Medicare IM given:   Medicare IM given by:   Date Additional Medicare IM given:   Additional Medicare IM given by:    Discharge Disposition:  [place]  Per UR Regulation:  Reviewed for med. necessity/level of care/duration of stay  If discussed at [place] of Stay Meetings, dates discussed:    Comments:  01/15/14 10:30am Ricki Miller, RN BSN Case Manager Case manager contacted Billy Byrd at [facility] [phone]- with referral for home health physical therapy. Orders faxed to her at [phone]. Start of care will be Thursday 8/27 or Friday, 01/18/14 . Someone from [place] will contact Billy Byrd.

## 2014-01-15 NOTE — Evaluation (Signed)
Physical Therapy Evaluation Patient Details Name: Billy Byrd MRN: [id number] DOB: June 25, 1940 Today's Date: 01/15/2014   History of Present Illness  73 y.o. male s/p right total hip arthroplasty. Hx of CAD, aortic aneurysm, and GERD.  Clinical Impression  Pt is s/p right THA presenting with the deficits listed below (see PT Problem List). Ambulates quite well post op day #1, safely maintaining 50% weight-bearing status. Anticipate he will progress quickly towards physical therapy goals. Wife works near home and reports she will be checking-in on him periodically throughout the day when he returns home. Pt will benefit from skilled PT to increase their independence and safety with mobility to allow discharge to the venue listed below.       Follow Up Recommendations Home health PT;Supervision - Intermittent    Equipment Recommendations  3in1 (PT)    Recommendations for Other Services OT consult     Precautions / Restrictions Precautions Precautions: None Restrictions Weight Bearing Restrictions: Yes RLE Weight Bearing: Partial weight bearing RLE Partial Weight Bearing Percentage or Pounds: 50%      Mobility  Bed Mobility Overal bed mobility: Needs Assistance Bed Mobility: Supine to Sit     Supine to sit: Supervision     General bed mobility comments: Supervision for safety. VC for technique.  Transfers Overall transfer level: Needs assistance Equipment used: Rolling walker (2 wheeled) Transfers: Sit to/from Stand Sit to Stand: Min guard         General transfer comment: Min guard for safety. Performed from lowest bed setting x2. VC for hand placement. Good control with descent into chair.  Ambulation/Gait Ambulation/Gait assistance: Min guard Ambulation Distance (Feet): 80 Feet Assistive device: Rolling walker (2 wheeled) Gait Pattern/deviations: Step-to pattern;Decreased step length - right;Decreased step length - left;Decreased stance time - right;Antalgic    Gait velocity interpretation: Below normal speed for age/gender General Gait Details: Educated on safe DME use with rolling walker. Demonstrates ability to safely maintain PWB 50% on RLE with gait. VC for walker placement and sequencing of gait.  Stairs            Wheelchair Mobility    Modified Rankin (Stroke Patients Only)       Balance Overall balance assessment: Needs assistance Sitting-balance support: No upper extremity supported;Feet supported Sitting balance-Leahy Scale: Good     Standing balance support: No upper extremity supported Standing balance-Leahy Scale: Fair                               Pertinent Vitals/Pain Pain Assessment: 0-10 Pain Score: 4  Pain Intervention(s): Limited activity within patient's tolerance;Monitored during session;Repositioned    Home Living Family/patient expects to be discharged to:: Private residence Living Arrangements: Spouse/significant other Available Help at Discharge: Family;Available PRN/intermittently Type of Home: House Home Access: Stairs to enter Entrance Stairs-Rails: None Entrance Stairs-Number of Steps: 1 Home Layout: One level Home Equipment: Walker - 2 wheels;Shower seat - built in      Prior Function Level of Independence: Independent               Hand Dominance   Dominant Hand: Right    Extremity/Trunk Assessment   Upper Extremity Assessment: Defer to OT evaluation           Lower Extremity Assessment: RLE deficits/detail RLE Deficits / Details: Decreased strength and ROM as expected post op       Communication   Communication: HOH  Cognition Arousal/Alertness: Awake/alert Behavior During  Therapy: WFL for tasks assessed/performed Overall Cognitive Status: Within Functional Limits for tasks assessed                      General Comments      Exercises Total Joint Exercises Ankle Circles/Pumps: AROM;Both;10 reps;Supine Quad Sets: Both;10  reps;Supine;Strengthening Heel Slides: AAROM;Right;10 reps;Supine Hip ABduction/ADduction: AAROM;10 reps;Supine;Right      Assessment/Plan    PT Assessment Patient needs continued PT services  PT Diagnosis Difficulty walking;Abnormality of gait;Acute pain   PT Problem List Decreased strength;Decreased range of motion;Decreased activity tolerance;Decreased balance;Decreased mobility;Decreased knowledge of use of DME;Pain  PT Treatment Interventions DME instruction;Gait training;Stair training;Functional mobility training;Therapeutic activities;Therapeutic exercise;Balance training;Neuromuscular re-education;Patient/family education;Modalities   PT Goals (Current goals can be found in the Care Plan section) Acute Rehab PT Goals Patient Stated Goal: Go home PT Goal Formulation: With patient Time For Goal Achievement: 01/22/14 Potential to Achieve Goals: Good    Frequency 7X/week   Barriers to discharge Decreased caregiver support Pt will be alone 8 hours during the day - Wife reports she works close to home and will be able to check on him intermittently during the day.    Co-evaluation               End of Session   Activity Tolerance: Patient tolerated treatment well Patient left: in chair;with call bell/phone within reach;with family/visitor present Nurse Communication: Mobility status         Time: 0831-0908 PT Time Calculation (min): 37 min   Charges:   PT Evaluation $Initial PT Evaluation Tier I: 1 Procedure PT Treatments $Gait Training: 8-22 mins $Therapeutic Exercise: 8-22 mins   PT G Codes:        Elayne Snare, [place]   [name] 01/15/2014, 11:01 AM

## 2014-01-15 NOTE — Progress Notes (Signed)
Physical Therapy Treatment Patient Details Name: Billy Byrd MRN: [id number] DOB: 1941/03/31 Today's Date: 01/15/2014    History of Present Illness 73 y.o. male s/p right total hip arthroplasty. Hx of CAD, aortic aneurysm, and GERD.    PT Comments    Progressing well towards physical therapy goals. Safely completed stair training and is able to maintain 50% weight-bearing status with all mobility today. Will follow up in AM; feel he is adequate for d/c from a mobility standpoint when medically ready. Patient will continue to benefit from skilled physical therapy services at home with HHPT to further improve independence with functional mobility.   Follow Up Recommendations  Home health PT;Supervision - Intermittent     Equipment Recommendations  3in1 (PT)    Recommendations for Other Services OT consult     Precautions / Restrictions Precautions Precautions: None Restrictions Weight Bearing Restrictions: Yes RLE Weight Bearing: Partial weight bearing RLE Partial Weight Bearing Percentage or Pounds: 50%    Mobility  Bed Mobility Overal bed mobility: Modified Independent             General bed mobility comments: Requires extra time. Able to enter and exit bed without assist or cues.  Transfers Overall transfer level: Needs assistance Equipment used: Rolling walker (2 wheeled) Transfers: Sit to/from Stand Sit to Stand: Min guard         General transfer comment: Min guard for safety. VC for hand placement. Performed from lowest bed setting and tub bench. Good ability to maintain 50% WB precaution on RLE.  Ambulation/Gait Ambulation/Gait assistance: Min guard Ambulation Distance (Feet): 60 Feet (x2) Assistive device: Rolling walker (2 wheeled) Gait Pattern/deviations: Step-to pattern;Decreased step length - right;Decreased step length - left;Decreased stance time - right;Antalgic   Gait velocity interpretation: Below normal speed for age/gender General Gait  Details: VC for walker placement and forward gaze. Demonstrates ability to maintain 50% WB through RLE while ambulating. No loss of balance noted.   Stairs Stairs: Yes Stairs assistance: Min guard Stair Management: No rails;Backwards;With walker;Step to pattern Number of Stairs: 1 (x2) General stair comments: Educated on safe stair navigation. Demonstrated to patient prior to having him practice. Was able to safely perform and teach back correct sequencing. Wife present and actively participated in this activity.  Wheelchair Mobility    Modified Rankin (Stroke Patients Only)       Balance                                    Cognition Arousal/Alertness: Awake/alert Behavior During Therapy: WFL for tasks assessed/performed Overall Cognitive Status: Within Functional Limits for tasks assessed                      Exercises Total Joint Exercises Ankle Circles/Pumps: AROM;Both;10 reps;Supine Quad Sets: 10 reps;Supine;Strengthening;Right Heel Slides: Right;Supine;5 reps;AROM    General Comments        Pertinent Vitals/Pain Pain Assessment: 0-10 Pain Score: 9  Pain Location: Rt hip Pain Intervention(s): Limited activity within patient's tolerance;Monitored during session;Repositioned;Patient requesting pain meds-RN notified    Home Living                      Prior Function            PT Goals (current goals can now be found in the care plan section) Acute Rehab PT Goals PT Goal Formulation: With patient Time For Goal Achievement:  01/22/14 Potential to Achieve Goals: Good Progress towards PT goals: Progressing toward goals    Frequency  7X/week    PT Plan Current plan remains appropriate    Co-evaluation             End of Session   Activity Tolerance: Patient tolerated treatment well Patient left: with call bell/phone within reach;with family/visitor present;in bed     Time: 8850-2774 PT Time Calculation (min): 27  min  Charges:  $Gait Training: 8-22 mins $Therapeutic Activity: 8-22 mins                    G Codes:      [employer], [name]   [name] 01/15/2014, 4:54 PM

## 2014-01-15 NOTE — Progress Notes (Signed)
INITIAL NUTRITION ASSESSMENT  DOCUMENTATION CODES Per approved criteria  -Not Applicable   INTERVENTION: Provide Boost Plus po BID, each supplement contains 360 kcals and 14 grams of protein.   NUTRITION DIAGNOSIS: Increased nutrient needs related to s/p surgery as evidenced by estimated nutrition needs.   Goal: Pt to meet >/= 90% of their estimated nutrition needs   Monitor:  PO intake, weight trends, labs, I/O's  Reason for Assessment: MST  73 y.o. male  Admitting Dx: Osteoarthritis of right hip  ASSESSMENT: Pt with PMH of arthritis, CAD, and GERD. Pt presents with history of pain and functional disability in the right hip(s) due to arthritis and patient has failed non-surgical conservative treatments. Pt has evidence of subchondral cysts, subchondral sclerosis and joint space narrowing by imaging studies.  Procedure(8/24): RIGHT TOTAL HIP ARTHROPLASTY ANTERIOR APPROACH (Right)  Pt reports having a good appetite. Meal completion is 100%. Pt reports he also has been eating good at home with 3 full meals a day currently. Pt does report he has lost 40 lbs over a year due to stress and a decreased appetite from losing a loved one, however now he reports his appetite is fine. Pt reports his usual body weight of 267 lbs where he last weighed one year ago. Pt reports he has been drinking Boost at home once a day to help with weight loss. Pt requests that he would like Boost ordered BID while hospitalized. Will order. Pt denies any current nausea or stomach pains.  Height: Ht Readings from Last 1 Encounters:  01/14/14 6\' 5"  (1.956 m)    Weight: Wt Readings from Last 1 Encounters:  01/14/14 227 lb 15.3 oz (103.4 kg)    Ideal Body Weight: 208 lbs  % Ideal Body Weight: 109%  Wt Readings from Last 10 Encounters:  01/14/14 227 lb 15.3 oz (103.4 kg)  01/14/14 227 lb 15.3 oz (103.4 kg)  01/07/14 227 lb 15.3 oz (103.4 kg)   Usual Body Weight: 267 lbs (last weighed one year  ago)  % Usual Body Weight: 85%  BMI:  Body mass index is 27.03 kg/(m^2).  Estimated Nutritional Needs: Kcal: 2200-2400 Protein: 100-110 grams Fluid: 2.2 L - 2.4 L/day  Skin: right hip incision  Diet Order: General  EDUCATION NEEDS: -No education needs identified at this time   Intake/Output Summary (Last 24 hours) at 01/15/14 0944 Last data filed at 01/15/14 0655  Gross per 24 hour  Intake   2150 ml  Output   2050 ml  Net    100 ml    Last BM: 8/24   Labs:   Recent Labs Lab 01/15/14 0615  NA 138  K 4.2  CL 101  CO2 29  BUN 9  CREATININE 0.90  CALCIUM 8.4  GLUCOSE 96    CBG (last 3)  No results found for this basename: GLUCAP,  in the last 72 hours  Scheduled Meds: . aspirin EC  81 mg Oral BID PC  . clopidogrel  75 mg Oral Daily  . docusate sodium  100 mg Oral BID  . ketorolac  7.5 mg Intravenous 4 times per day  . levothyroxine  150 mcg Oral Q48H  . [START ON 01/16/2014] levothyroxine  175 mcg Oral Q48H  . pantoprazole  80 mg Oral Q1200    Continuous Infusions: . sodium chloride 75 mL/hr at 01/14/14 1811    Past Medical History  Diagnosis Date  . Coronary artery disease   . Aortic aneurysm     stent done  6 yrs ago  . Sleep apnea     cpap used since 04  . Hypothyroidism   . GERD (gastroesophageal reflux disease)   . Arthritis     Past Surgical History  Procedure Laterality Date  . Abdominal aortic aneurysm repair      6 yrs ago stent  . Back surgery  84,10  . Hernia repair      bilateral inguinal    Kallie Locks, MS, Provisional LDN Pager # [phone] After hours/ weekend pager # [phone]

## 2014-01-15 NOTE — Op Note (Signed)
NAMEHANNAN, HUTMACHER NO.:  [id number]  MEDICAL RECORD NO.:  [id number]  LOCATION:  [facility]                         FACILITY:  [facility]  PHYSICIAN:  Alta Corning, M.D.   DATE OF BIRTH:  10-02-1940  DATE OF PROCEDURE:  01/14/2014 DATE OF DISCHARGE:                              OPERATIVE REPORT   PREOPERATIVE DIAGNOSIS:  End-stage degenerative joint disease, right hip.  POSTOPERATIVE DIAGNOSIS:  End-stage degenerative joint disease, right hip.  PROCEDURE:  Right total hip replacement from an anterior approach with a size 14 carotid stem with a KLA high offset neck and a -1.5 hip ball.  SURGEON:  Alta Corning, M.D.  ASSISTANT:  Gary Fleet, P.A.  ANESTHESIA:  General.  BRIEF HISTORY:  Mr. Mroczkowski is a 73 year old male with a history and significant complaints of right hip pain.  We treated him conservatively for a period of time.  After a fairly well conservative care, an x-ray showing bone-on-bone change.  We felt that the appropriate course of action would be right total hip replacement, so he was brought to the operating room for this procedure.  DESCRIPTION OF PROCEDURE:  The patient was brought to the operating room.  After adequate anesthesia was obtained with general anesthetic, the patient was placed supine on the operating table.  The right hip was prepped and draped in usual sterile fashion.  The patient initially had been placed on the Hana bed and feet in traction boots.  Intraoperative x-rays were taken at this point showing a significantly narrowed hip on the right side and at this point, he was prepped and draped in usual sterile fashion and we made an anterior approach to the hip. Subcutaneous tissue down the level of the tensor fascia, it was divided in line with its fibers and retractors were put in place above and below the hip.  Once this was done, the capsule was opened and tagged and then a provisional neck cut was made under  fluoroscopic guidance.  Once that was done, the head ball was removed and the hip was externally rotated with a slight traction, retractors were put in place.  We sequentially reamed the acetabulum to a level of 59 and a 60 mm pinnacle cup with a porous coating was used.  A hole eliminator was used.  A 36 head ball neutral poly +4 was placed at this point, the final and attention at this time was turned towards the stem.  A retractor was put in place on the stem side and once this was done, the Hana bed was used to adduct the leg and elevate the femur and at this point, the femur was sequentially rasped to a level of 14.  This got excellent fit distally __________ we could get a little more proximally, but distally we were full and had good fixation at this point.  A trial standard ball was used, looked a little bit long at that point, I went to a KLA offset so I did think he had high offset to began with and KLA __________ offset gave [place] excellent neutral leg length.  At this time, the hip was put in 60 degrees of extension  and 100 of external rotation.  No tendency towards subluxation or dislocation.  At this point, the final KLA 14 with high offset was used, minus hip ball 36 mm was placed and the hip then reduced and the capsule was closed and the 40 mL Exparel were placed in and around the hip capsule as well as the musculature there, close the fascia, and then the skin. A sterile compressive dressing was applied.  The patient was taken to the recovery room in satisfactory condition.  Estimated blood loss for procedure was minimal.     Alta Corning, M.D.     Corliss Skains  D:  01/14/2014  T:  01/14/2014  Job:  [id number]

## 2014-01-15 NOTE — Care Management Utilization Note (Signed)
Utilization review completed. Mckynzi Cammon, RN BSN Case Manager 

## 2014-01-15 NOTE — Progress Notes (Signed)
Subjective: 1 Day Post-Op Procedure(s) (LRB): RIGHT TOTAL HIP ARTHROPLASTY ANTERIOR APPROACH (Right) Patient reports pain as moderate. Not out of bed yet.  Voiding well.  Taking by mouth well.   Objective: Vital signs in last 24 hours: Temp:  [95.1 F (35.1 C)-98.3 F (36.8 C)] 97.7 F (36.5 C) (08/25 0616) Pulse Rate:  [64-81] 81 (08/25 0840) Resp:  [9-18] 11 (08/24 1643) BP: (103-145)/(58-87) 119/59 mmHg (08/25 0616) SpO2:  [93 %-100 %] 97 % (08/25 0840) Weight:  [103.4 kg (227 lb 15.3 oz)] 103.4 kg (227 lb 15.3 oz) (08/24 1033)  Intake/Output from previous day: 08/24 0701 - 08/25 0700 In: 2150 [P.O.:240; I.V.:1910] Out: 2050 [Urine:1550; Blood:500] Intake/Output this shift:     Recent Labs  01/15/14 0615  HGB 11.0*    Recent Labs  01/15/14 0615  WBC 8.0  RBC 3.33*  HCT 32.3*  PLT 167    Recent Labs  01/15/14 0615  NA 138  K 4.2  CL 101  CO2 29  BUN 9  CREATININE 0.90  GLUCOSE 96  CALCIUM 8.4   Right hip exam: Neurovascular intact Sensation intact distally Intact pulses distally Dorsiflexion/Plantar flexion intact Incision: dressing C/D/I Compartment soft  Assessment/Plan: 1 Day Post-Op Procedure(s) (LRB): RIGHT TOTAL HIP ARTHROPLASTY ANTERIOR APPROACH (Right) Plan: Up with therapy Plan for discharge tomorrow with home health physical therapy. 50% weightbearing on right lower extremity without hip precautions. Aspirin/Plavix for DVT prophylaxis along with SCDs.  Leyland Kenna G 01/15/2014, 8:56 AM

## 2014-01-15 NOTE — Evaluation (Signed)
Occupational Therapy Evaluation Patient Details Name: Billy Byrd MRN: [id number] DOB: 12/07/1940 Today's Date: 01/15/2014    History of Present Illness 73 y.o. male s/p right total hip arthroplasty. Hx of CAD, aortic aneurysm, and GERD.   Clinical Impression   Pt admitted with the above diagnoses and presents with below problem list. Pt will benefit from continued acute OT to address the below listed deficits and maximize independence with basic ADLs prior to d/c home. PTA pt was independent with ADLs. Pt currently at min guard level for ADLs. Pt's wife works "3 minutes from home" and pt's sister will be assisting during the day. Pt maintaining PWB 50% status well during session.     Follow Up Recommendations  Supervision - Intermittent;No OT follow up    Equipment Recommendations  3 in 1 bedside comode    Recommendations for Other Services       Precautions / Restrictions Precautions Precautions: None Restrictions Weight Bearing Restrictions: Yes RLE Weight Bearing: Partial weight bearing RLE Partial Weight Bearing Percentage or Pounds: 50%      Mobility Bed Mobility Overal bed mobility: Needs Assistance Bed Mobility: Supine to Sit     Supine to sit: Supervision        Transfers Overall transfer level: Needs assistance Equipment used: Rolling walker (2 wheeled) Transfers: Sit to/from Stand Sit to Stand: Min guard         General transfer comment: good technique    Balance Overall balance assessment: Needs assistance Sitting-balance support: No upper extremity supported;Feet supported Sitting balance-Leahy Scale: Good     Standing balance support: Bilateral upper extremity supported;During functional activity Standing balance-Leahy Scale: Fair                              ADL Overall ADL's : Needs assistance/impaired Eating/Feeding: Set up;Sitting   Grooming: Set up;Sitting;Standing   Upper Body Bathing: Set up;Sitting   Lower Body  Bathing: Min guard;With adaptive equipment;Sit to/from stand   Upper Body Dressing : Set up;Sitting   Lower Body Dressing: Min guard;With adaptive equipment;Sit to/from stand   Toilet Transfer: Min guard;Ambulation;RW (3n1 over toilet)   Toileting- Clothing Manipulation and Hygiene: Min guard;With adaptive equipment;Sit to/from stand   Tub/ Shower Transfer: Min guard;Ambulation;3 in 1;Rolling walker   Functional mobility during ADLs: Min guard;Rolling walker General ADL Comments: Educated pt and spouse on techniques and AE for safe completion of ADLs.      Vision                     Perception     Praxis      Pertinent Vitals/Pain Pain Assessment: 0-10 Pain Score: 8  Pain Location: R hip Pain Descriptors / Indicators: Aching Pain Intervention(s): Limited activity within patient's tolerance;Monitored during session;Patient requesting pain meds-RN notified;Repositioned;RN gave pain meds during session     Hand Dominance Right   Extremity/Trunk Assessment Upper Extremity Assessment Upper Extremity Assessment: Overall WFL for tasks assessed   Lower Extremity Assessment Lower Extremity Assessment: Defer to PT evaluation       Communication Communication Communication: HOH   Cognition Arousal/Alertness: Awake/alert Behavior During Therapy: WFL for tasks assessed/performed Overall Cognitive Status: Within Functional Limits for tasks assessed                     General Comments       Exercises       Shoulder Instructions  Home Living Family/patient expects to be discharged to:: Private residence Living Arrangements: Spouse/significant other Available Help at Discharge: Family;Available PRN/intermittently Type of Home: House Home Access: Stairs to enter [employer] of Steps: 1 Entrance Stairs-Rails: None Home Layout: One level     Bathroom Shower/Tub: Teacher, early years/pre: Standard Bathroom Accessibility:  Yes How Accessible: Accessible via walker Home Equipment: [place] - 2 wheels;Shower seat - built in          Prior Functioning/Environment Level of Independence: Independent             OT Diagnosis: Acute pain   OT Problem List: Impaired balance (sitting and/or standing);Decreased knowledge of use of DME or AE;Decreased knowledge of precautions;Pain   OT Treatment/Interventions: Self-care/ADL training;Therapeutic exercise;DME and/or AE instruction;Therapeutic activities;Patient/family education;Balance training    OT Goals(Current goals can be found in the care plan section) Acute Rehab OT Goals Patient Stated Goal: "to golf in 3 weeks" OT Goal Formulation: With patient/family Time For Goal Achievement: 01/22/14 Potential to Achieve Goals: Good ADL Goals Pt Will Perform Lower Body Bathing: with modified independence;with adaptive equipment;sit to/from stand Pt Will Perform Lower Body Dressing: with modified independence;with adaptive equipment;sit to/from stand Pt Will Transfer to Toilet: with modified independence;ambulating (3n1 over toilet) Pt Will Perform Toileting - Clothing Manipulation and hygiene: with modified independence;with adaptive equipment;sit to/from stand Pt Will Perform Tub/Shower Transfer: with modified independence;ambulating;3 in 1;rolling walker  OT Frequency: Min 2X/week   Barriers to D/C:            Co-evaluation              End of Session Equipment Utilized During Treatment: Gait belt;Rolling walker Nurse Communication: Patient requests pain meds  Activity Tolerance: Patient tolerated treatment well;Patient limited by pain Patient left: in chair;with call bell/phone within reach;with family/visitor present   Time: 1132-1203 OT Time Calculation (min): 31 min Charges:  OT General Charges $OT Visit: 1 Procedure OT Evaluation $Initial OT Evaluation Tier I: 1 Procedure OT Treatments $Self Care/Home Management : 23-37 mins G-Codes:     Hortencia Pilar Jan 25, 2014, 12:14 PM

## 2014-01-16 LAB — CBC
HEMATOCRIT: 30.1 % — AB (ref 39.0–52.0)
Hemoglobin: 9.9 g/dL — ABNORMAL LOW (ref 13.0–17.0)
MCH: 31.6 pg (ref 26.0–34.0)
MCHC: 32.9 g/dL (ref 30.0–36.0)
MCV: 96.2 fL (ref 78.0–100.0)
PLATELETS: 155 10*3/uL (ref 150–400)
RBC: 3.13 MIL/uL — AB (ref 4.22–5.81)
RDW: 13.8 % (ref 11.5–15.5)
WBC: 9.1 10*3/uL (ref 4.0–10.5)

## 2014-01-16 NOTE — Discharge Summary (Signed)
Patient ID: Billy Byrd MRN: [id number] DOB/AGE: 1941/02/13 73 y.o.  Admit date: 01/14/2014 Discharge date: 01/16/2014  Admission Diagnoses:  Principal Problem:   Osteoarthritis of right hip   Discharge Diagnoses:  Same  Past Medical History  Diagnosis Date  . Coronary artery disease   . Aortic aneurysm     stent done 6 yrs ago  . Sleep apnea     cpap used since 04  . Hypothyroidism   . GERD (gastroesophageal reflux disease)   . Arthritis     Surgeries: Procedure(s): RIGHT TOTAL HIP ARTHROPLASTY ANTERIOR APPROACH on 01/14/2014    Discharged Condition: Improved  Hospital Course: Billy Byrd is an 73 y.o. male who was admitted 01/14/2014 for operative treatment ofOsteoarthritis of right hip. Patient has severe unremitting pain that affects sleep, daily activities, and work/hobbies. After pre-op clearance the patient was taken to the operating room on 01/14/2014 and underwent  Procedure(s): RIGHT TOTAL HIP ARTHROPLASTY ANTERIOR APPROACH.    Patient was given perioperative antibiotics:     Anti-infectives   Start     Dose/Rate Route Frequency Ordered Stop   01/14/14 1800  ceFAZolin (ANCEF) IVPB 2 Byrd/50 mL premix     2 Byrd 100 mL/hr over 30 Minutes Intravenous Every 6 hours 01/14/14 1723 01/15/14 0008   01/14/14 0600  ceFAZolin (ANCEF) IVPB 2 Byrd/50 mL premix     2 Byrd 100 mL/hr over 30 Minutes Intravenous On call to O.R. 01/13/14 1404 01/14/14 1208       Patient was given sequential compression devices, early ambulation, and chemoprophylaxis to prevent DVT.  Patient benefited maximally from hospital stay and there were no complications.    Recent vital signs:  Patient Vitals for the past 24 hrs:  BP Temp Temp src Pulse Resp SpO2  01/16/14 0430 107/55 mmHg 98 F (36.7 C) - 73 16 98 %  01/15/14 2004 127/57 mmHg 98.7 F (37.1 C) Oral 85 18 98 %  01/15/14 1450 99/41 mmHg 98.6 F (37 C) Oral 85 18 97 %     Recent laboratory studies:   Recent Labs  01/15/14 0615  01/16/14 0510  WBC 8.0 9.1  HGB 11.0* 9.9*  HCT 32.3* 30.1*  PLT 167 155  NA 138  --   K 4.2  --   CL 101  --   CO2 29  --   BUN 9  --   CREATININE 0.90  --   GLUCOSE 96  --   CALCIUM 8.4  --      Discharge Medications:     Medication List    STOP taking these medications       ibuprofen 200 MG tablet  Commonly known as:  ADVIL,MOTRIN      TAKE these medications       aspirin 81 MG tablet  Take 81 mg by mouth daily.     calcium carbonate 200 MG capsule  Take 1,000 mg by mouth 1 day or 1 dose.     calcium-vitamin D 500-200 MG-UNIT per tablet  Take 1 tablet by mouth daily.     clopidogrel 75 MG tablet  Commonly known as:  PLAVIX  Take 75 mg by mouth daily.     cyanocobalamin 1000 MCG/ML injection  Commonly known as:  (VITAMIN B-12)  Inject 1,000 mcg into the muscle every 30 (thirty) days. First of each month     esomeprazole 40 MG capsule  Commonly known as:  NEXIUM  Take 40 mg by mouth daily at 12 noon.  FISH OIL + D3 1000-1000 MG-UNIT Caps  Take 1,000 Units by mouth 1 day or 1 dose.     levothyroxine 175 MCG tablet  Commonly known as:  SYNTHROID, LEVOTHROID  Take 175 mcg by mouth every other day. Take 175 mcg every other day alternating with 150 mcg     levothyroxine 150 MCG tablet  Commonly known as:  SYNTHROID, LEVOTHROID  Take 150 mcg by mouth every other day. Take 150 mcg every other day, alternating with 175 mcg     oxyCODONE-acetaminophen 5-325 MG per tablet  Commonly known as:  PERCOCET/ROXICET  Take 1-2 tablets by mouth every 6 (six) hours as needed for severe pain.        Diagnostic Studies: Dg Chest 2 View  01/07/2014   CLINICAL DATA:  Preop for right hip arthroplasty, sleep apnea  EXAM: CHEST  2 VIEW  COMPARISON:  10/22/2013.  FINDINGS: Cardiomediastinal silhouette is unremarkable. Mild hyperinflation. Probable chronic mild interstitial prominence. No acute infiltrate or pulmonary edema.  IMPRESSION: No active disease.  Question  chronic mild interstitial prominence.   Electronically Signed   By: Lahoma Crocker M.D.   On: 01/07/2014 13:59   Dg Hip Operative Right  01/14/2014   CLINICAL DATA:  73 year old male undergoing right hip arthroplasty. Initial encounter.  EXAM: DG OPERATIVE RIGHT HIP  TECHNIQUE: A single spot fluoroscopic AP image of the right hip is submitted.  COMPARISON:  CT Abdomen and Pelvis 11/13/2013.  FLUOROSCOPY TIME:  0 min 15 seconds.  FINDINGS: 2 intraoperative fluoroscopic views of the right hip and lower pelvis. Right bipolar hip arthroplasty hardware. Chronic right inguinal surgical clips. Chronic penile prosthesis.  IMPRESSION: Bipolar right hip arthroplasty.   Electronically Signed   By: Lars Pinks M.D.   On: 01/14/2014 14:21   Dg Pelvis Portable  01/14/2014   CLINICAL DATA:  Postop right total hip arthroplasty.  EXAM: PORTABLE PELVIS 1-2 VIEWS  COMPARISON:  CT abdomen pelvis 11/13/2013  FINDINGS: Single AP view of the pelvis submitted. There are immediate postoperative changes of total right hip arthroplasty. Expected locules of gas are seen adjacent to the proximal right femur. No hardware complication or periprosthetic fracture is identified. Penile prosthesis noted. Bilateral common iliac artery stents are visualized. Surgical clips in the right groin.  IMPRESSION: Immediate postoperative changes of total right hip arthroplasty. No acute complicating feature identified.   Electronically Signed   By: Curlene Dolphin M.D.   On: 01/14/2014 15:27   Dg Hip Portable 1 View Right  01/14/2014   CLINICAL DATA:  Postop.  EXAM: PORTABLE RIGHT HIP - 1 VIEW  COMPARISON:  Earlier same day  FINDINGS: Examination demonstrates evidence of patient's right total hip arthroplasty. Acetabular and femoral prostheses are intact. There is 3 mm of lucency between most medial aspect of the proximal femoral stem of the prosthesis and adjacent bone unchanged. Penile prosthesis is present.  IMPRESSION: Right total hip arthroplasty as  described. Recommend correlation with findings at the time of the procedure.   Electronically Signed   By: Marin Olp M.D.   On: 01/14/2014 15:22    Disposition: Home with home health physical therapy  Discharge Instructions   Call MD / Call 911    Complete by:  As directed   If you experience chest pain or shortness of breath, CALL 911 and be transported to the hospital emergency room.  If you develope a fever above 101 F, pus (white drainage) or increased drainage or redness at the wound, or calf  pain, call your surgeon's office.     Constipation Prevention    Complete by:  As directed   Drink plenty of fluids.  Prune juice may be helpful.  You may use a stool softener, such as Colace (over the counter) 100 mg twice a day.  Use MiraLax (over the counter) for constipation as needed.     Diet general    Complete by:  As directed      Face-to-face encounter (required for Medicare/Medicaid patients)    Complete by:  As directed   I Billy Byrd certify that this patient is under my care and that I, or a nurse practitioner or physician's assistant working with me, had a face-to-face encounter that meets the physician face-to-face encounter requirements with this patient on 01/16/2014. The encounter with the patient was in whole, or in part for the following medical condition(s) which is the primary reason for home health care (List medical condition):  Osteoarthritis right hip status post anterior right total hip replacement  He will need home health physical therapy 3 times a week x2 weeks. 50% weightbearing on the right with a walker. No hip precautions. If any questions please call us at [phone]  The encounter with the patient was in whole, or in part, for the following medical condition, which is the primary reason for home health care:  Osteoarthritis right hip  I certify that, based on my findings, the following services are medically necessary home health services:  Physical therapy   My clinical findings support the need for the above services:  Unable to leave home safely without assistance and/or assistive device  Further, I certify that my clinical findings support that this patient is homebound due to:  Unable to leave home safely without assistance  Reason for Medically Necessary Home Health Services:  Therapy- Personnel officer, Public librarian     Home Health    Complete by:  As directed   To provide the following care/treatments:  PT     Increase activity slowly as tolerated    Complete by:  As directed      Partial weight bearing    Complete by:  As directed   % Body Weight:  50%  Laterality:  right  Extremity:  Lower  No hip precautions.     Partial weight bearing    Complete by:  As directed   % Body Weight:  50%  Laterality:  right  Extremity:  Lower           Follow-up Information   Follow up with [facility]. Schedule an appointment as soon as possible for a visit in 2 weeks.   Contact information:   [phone]      Follow up with GRAVES,JOHN L, MD. Schedule an appointment as soon as possible for a visit in 2 weeks.   Specialty:  Orthopedic Surgery   Contact information:   [place] [address] [phone]        Signed: Erlene Senters 01/16/2014, 11:06 AM

## 2014-01-16 NOTE — Progress Notes (Signed)
Note/chart reviewed.  Billy Byrd, RD, LDN Pager #: [phone] After-Hours Pager #: [phone]  

## 2014-01-16 NOTE — Progress Notes (Signed)
Subjective: 2 Days Post-Op Procedure(s) (LRB): RIGHT TOTAL HIP ARTHROPLASTY ANTERIOR APPROACH (Right) Patient reports pain as mild.  Taking by mouth and voiding okay. Positive flatus. Ready for discharge home.  Objective: Vital signs in last 24 hours: Temp:  [98 F (36.7 C)-98.7 F (37.1 C)] 98 F (36.7 C) (08/26 0430) Pulse Rate:  [73-85] 73 (08/26 0430) Resp:  [16-18] 16 (08/26 0430) BP: (99-127)/(41-57) 107/55 mmHg (08/26 0430) SpO2:  [97 %-98 %] 98 % (08/26 0430)  Intake/Output from previous day: 08/25 0701 - 08/26 0700 In: 2055.8 [P.O.:720; I.V.:1335.8] Out: 1200 [Urine:1200] Intake/Output this shift: Total I/O In: 360 [P.O.:360] Out: -    Recent Labs  01/15/14 0615 01/16/14 0510  HGB 11.0* 9.9*    Recent Labs  01/15/14 0615 01/16/14 0510  WBC 8.0 9.1  RBC 3.33* 3.13*  HCT 32.3* 30.1*  PLT 167 155    Recent Labs  01/15/14 0615  NA 138  K 4.2  CL 101  CO2 29  BUN 9  CREATININE 0.90  GLUCOSE 96  CALCIUM 8.4   Right hip exam Neurovascular intact Intact pulses distally Dorsiflexion/Plantar flexion intact Incision: dressing C/D/I Compartment soft  Assessment/Plan: 2 Days Post-Op Procedure(s) (LRB): RIGHT TOTAL HIP ARTHROPLASTY ANTERIOR APPROACH (Right) Plan: Will need home health physical therapy 3 times a week x2 weeks  50% weightbearing on the right hip with a walker. No hip precautions. Discharge home with home health Plavix and aspirin which he was on preoperatively for DVT prophylaxis. Followup with Dr. Berenice Primas in 2 weeks  [place] 01/16/2014, 10:54 AM

## 2014-01-16 NOTE — Progress Notes (Signed)
Physical Therapy Treatment Patient Details Name: Shashwat Cleary MRN: [id number] DOB: 09-20-1940 Today's Date: 01/16/2014    History of Present Illness 73 y.o. male s/p right total hip arthroplasty. Hx of CAD, aortic aneurysm, and GERD.    PT Comments    Patient continues to progress well towards physical therapy goals ambulating up to 100 feet with supervision while using a rolling walker. All education has been reviewed and he safely completed stair training yesterday. Pt reports he feels confident in his abilities and has no further questions concerning mobility at this time. Feel he is adequate for d/c from a PT standpoint. Patient will continue to benefit from skilled physical therapy services at home with HHPT to further improve independence with functional mobility.   Follow Up Recommendations  Home health PT;Supervision - Intermittent     Equipment Recommendations  3in1 (PT)    Recommendations for Other Services OT consult     Precautions / Restrictions Precautions Precautions: None Restrictions Weight Bearing Restrictions: Yes RLE Weight Bearing: Partial weight bearing RLE Partial Weight Bearing Percentage or Pounds: 50%    Mobility  Bed Mobility Overal bed mobility: Modified Independent                Transfers Overall transfer level: Needs assistance Equipment used: Rolling walker (2 wheeled) Transfers: Sit to/from Stand Sit to Stand: Min guard         General transfer comment: Min guard for safety. VC for hand placement. Mild instability upon standing this AM. Maintains weight bearing status.  Ambulation/Gait Ambulation/Gait assistance: Supervision Ambulation Distance (Feet): 100 Feet Assistive device: Rolling walker (2 wheeled) Gait Pattern/deviations: Step-to pattern;Decreased step length - left;Decreased stance time - right;Antalgic   Gait velocity interpretation: Below normal speed for age/gender General Gait Details: Safely follows weight  bearing status on RLE. VC for forward gaze and walker placement during turns. No loss of balance during ambulatory bout.   Stairs            Wheelchair Mobility    Modified Rankin (Stroke Patients Only)       Balance                                    Cognition Arousal/Alertness: Awake/alert Behavior During Therapy: WFL for tasks assessed/performed Overall Cognitive Status: Within Functional Limits for tasks assessed                      Exercises Total Joint Exercises Long Arc Quad: AROM;Right;10 reps;Seated    General Comments General comments (skin integrity, edema, etc.): Pt verbalizes understanding of therapeutic exercises and weight bearing status for RLE.      Pertinent Vitals/Pain Pain Assessment: 0-10 Pain Score:  ("feels less sore today") Pain Location: Rt hip Pain Intervention(s): Limited activity within patient's tolerance;Monitored during session;Premedicated before session;Repositioned    Home Living                      Prior Function            PT Goals (current goals can now be found in the care plan section) Acute Rehab PT Goals PT Goal Formulation: With patient Time For Goal Achievement: 01/22/14 Potential to Achieve Goals: Good Progress towards PT goals: Progressing toward goals    Frequency  7X/week    PT Plan Current plan remains appropriate    Co-evaluation  End of Session   Activity Tolerance: Patient tolerated treatment well Patient left: with call bell/phone within reach;with family/visitor present;in chair     Time: 1886-7737 PT Time Calculation (min): 13 min  Charges:  $Gait Training: 8-22 mins                    G Codes:      Elayne Snare, [place]  [name] 01/16/2014, 9:59 AM

## 2014-01-16 NOTE — Care Management Note (Signed)
CARE MANAGEMENT NOTE 01/16/2014  Patient:  Billy Byrd, Billy Byrd   Account Number:  [id number]  Date Initiated:  01/15/2014  Documentation initiated by:  Ricki Miller  Subjective/Objective Assessment:   73 yr old male s/p right total hip anterior approach.     Action/Plan:   Case manager spoke with patient and significant other concerning home health and DME needs at discharge. Choice offered. Patient lives in [place], [place].   Anticipated DC Date:  01/16/2014   Anticipated DC Plan:  [place]  In-house referral  Clinical Social Worker      DC Planning Services  CM consult      [facility] Choice  HOME HEALTH  DURABLE MEDICAL EQUIPMENT   Choice offered to / List presented to:  C-1 Patient   DME arranged  [place]  3-N-1      DME agency  [place] arranged  [place]      [facility] agency  [facility]   Status of service:  Completed, signed off Medicare Important Message given?  NA - LOS <3 / Initial given by admissions (If response is "NO", the following Medicare IM given date fields will be blank) Date Medicare IM given:   Medicare IM given by:   Date Additional Medicare IM given:   Additional Medicare IM given by:    Discharge Disposition:  [place]  Per UR Regulation:  Reviewed for med. necessity/level of care/duration of stay  If discussed at [place] of Stay Meetings, dates discussed:    Comments:  01/16/14 0930 Ricki Miller, RN BSN Case Manager Per Ladell Heads, RN case manager, patient was setup with [facility] and that is agency they want.This was not relayed to this Probation officer previously.  CM contacted [facility] and canceled Home Health.

## 2015-05-24 IMAGING — RF DG HIP OPERATIVE*R*
1 series · 4 of 4 positions shown · non-contrast
Comparison: CT Abdomen and Pelvis 11/13/2013.

FLUOROSCOPY TIME:  0 min 15 seconds.

CLINICAL DATA: 72-year-old male undergoing right hip arthroplasty.
Initial encounter.

EXAM:
DG OPERATIVE RIGHT HIP
TECHNIQUE: A single spot fluoroscopic AP image of the right hip is submitted.

[Series 1: run · 4 of 4 slices shown]
[im 1/4]
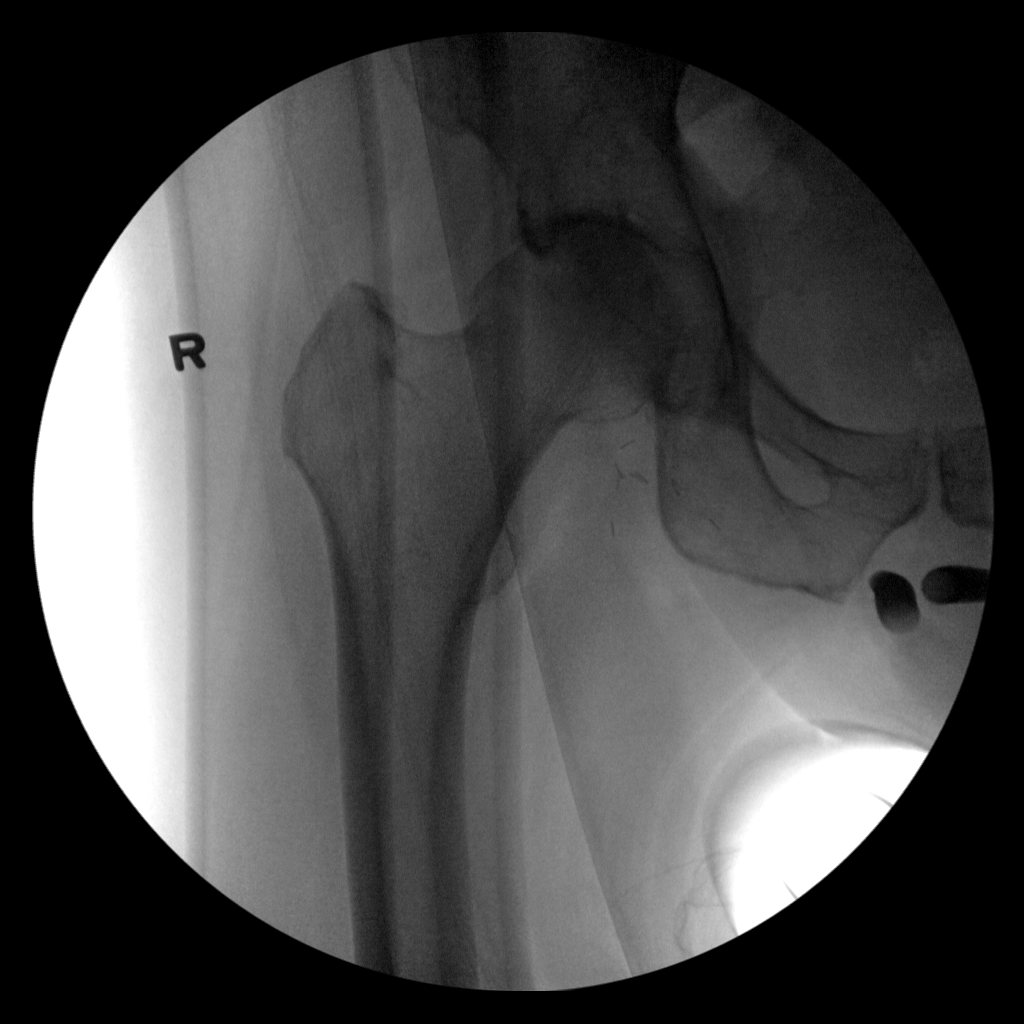
[im 2/4]
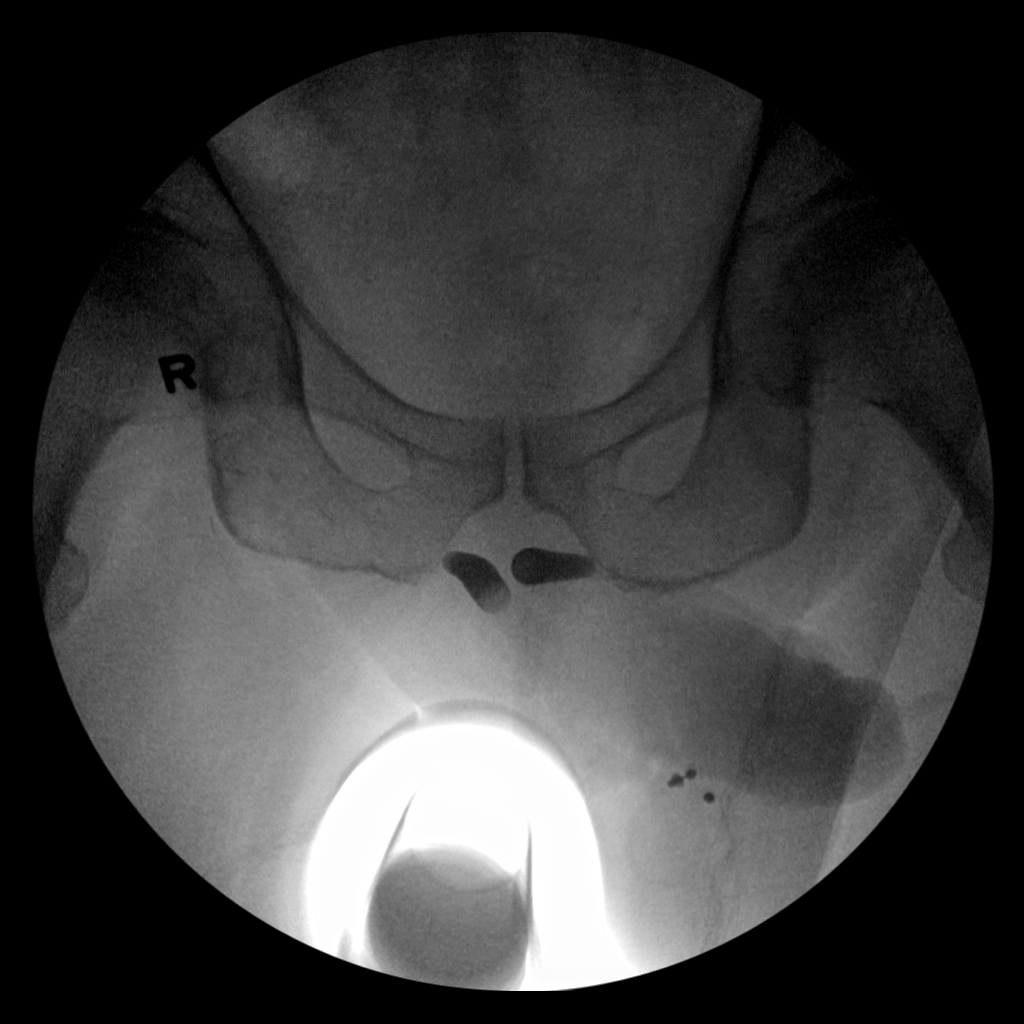
[im 3/4]
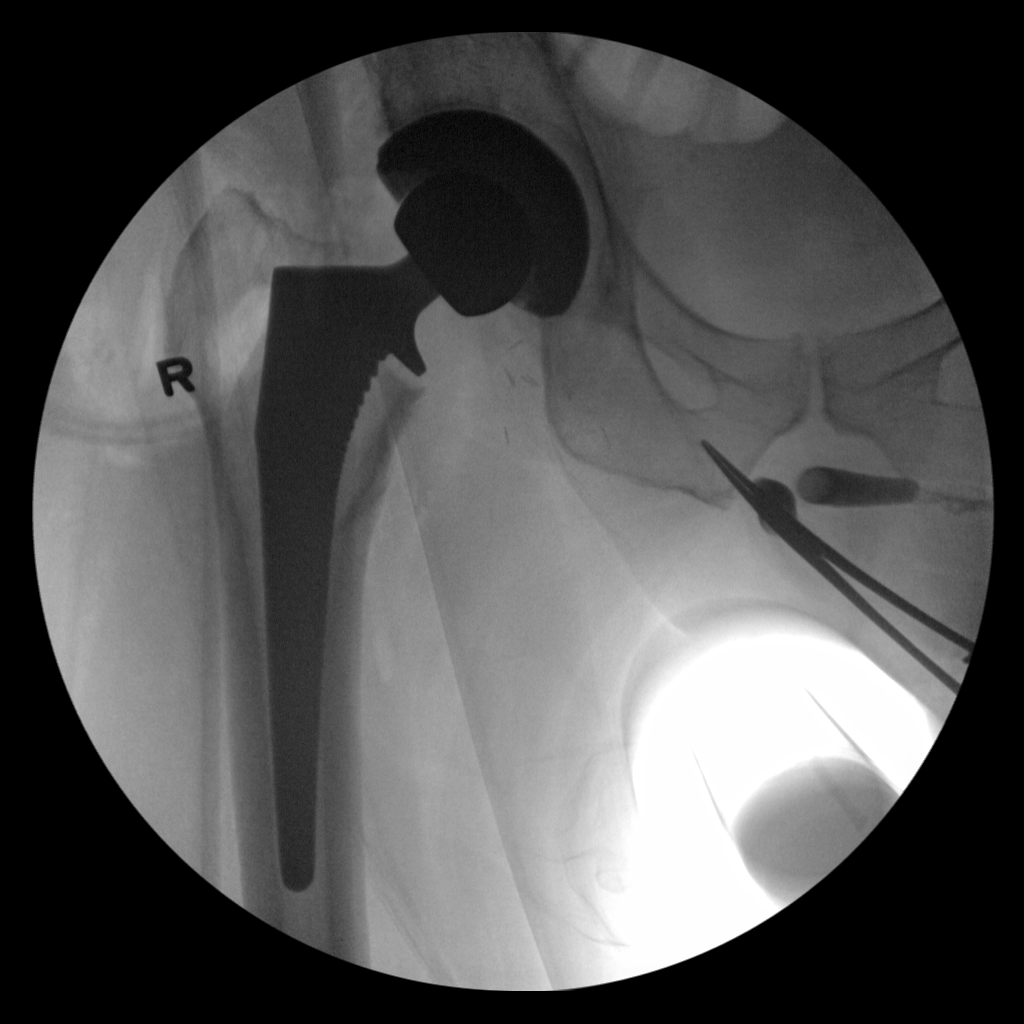
[im 4/4]
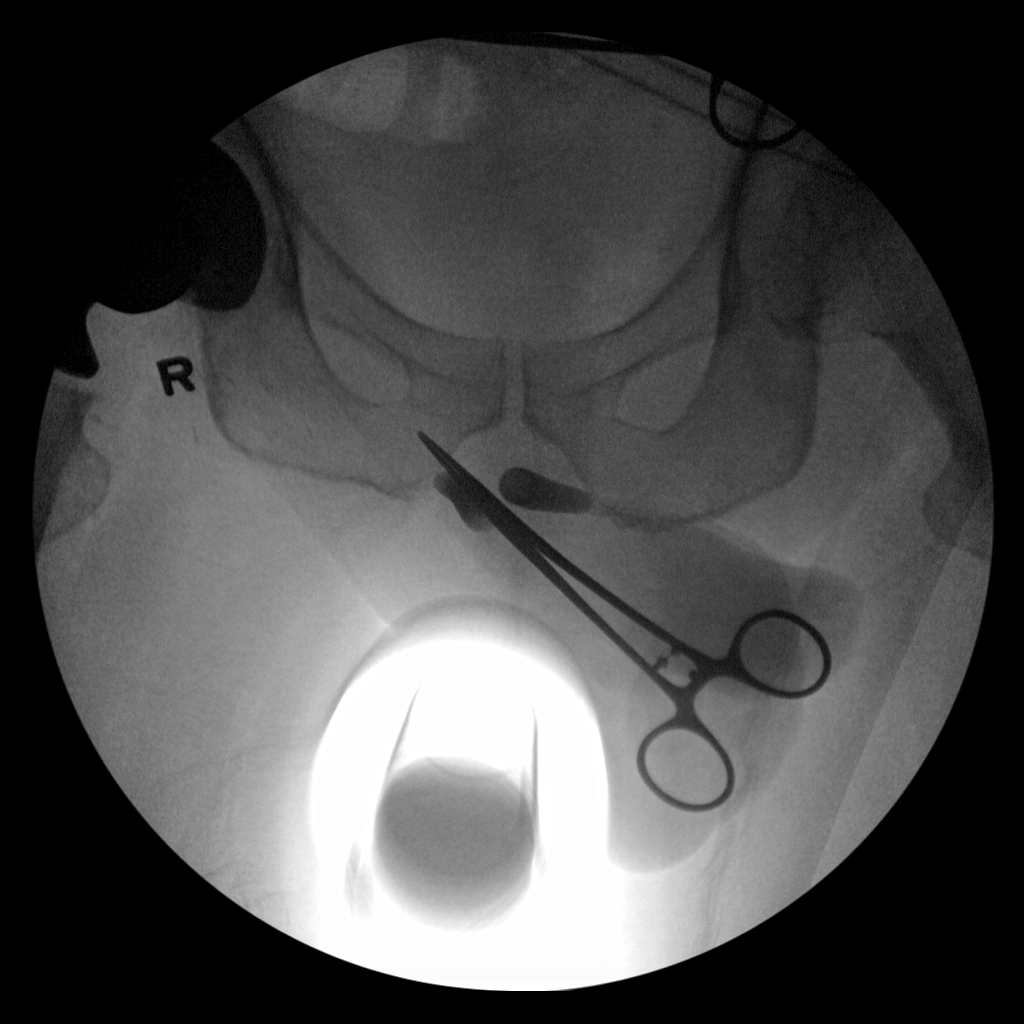

[4 of 4 positions shown; findings below may reference images not displayed]

FINDINGS: 2 intraoperative fluoroscopic views of the right hip and lower
pelvis. Right bipolar hip arthroplasty hardware. Chronic right
inguinal surgical clips. Chronic penile prosthesis.
IMPRESSION: Bipolar right hip arthroplasty.

## 2015-05-24 IMAGING — CR DG PORTABLE PELVIS
1 series · 1 of 1 positions shown · non-contrast
Comparison: CT abdomen pelvis 11/13/2013

CLINICAL DATA: Postop right total hip arthroplasty.

EXAM:
PORTABLE PELVIS 1-2 VIEWS

[AP]
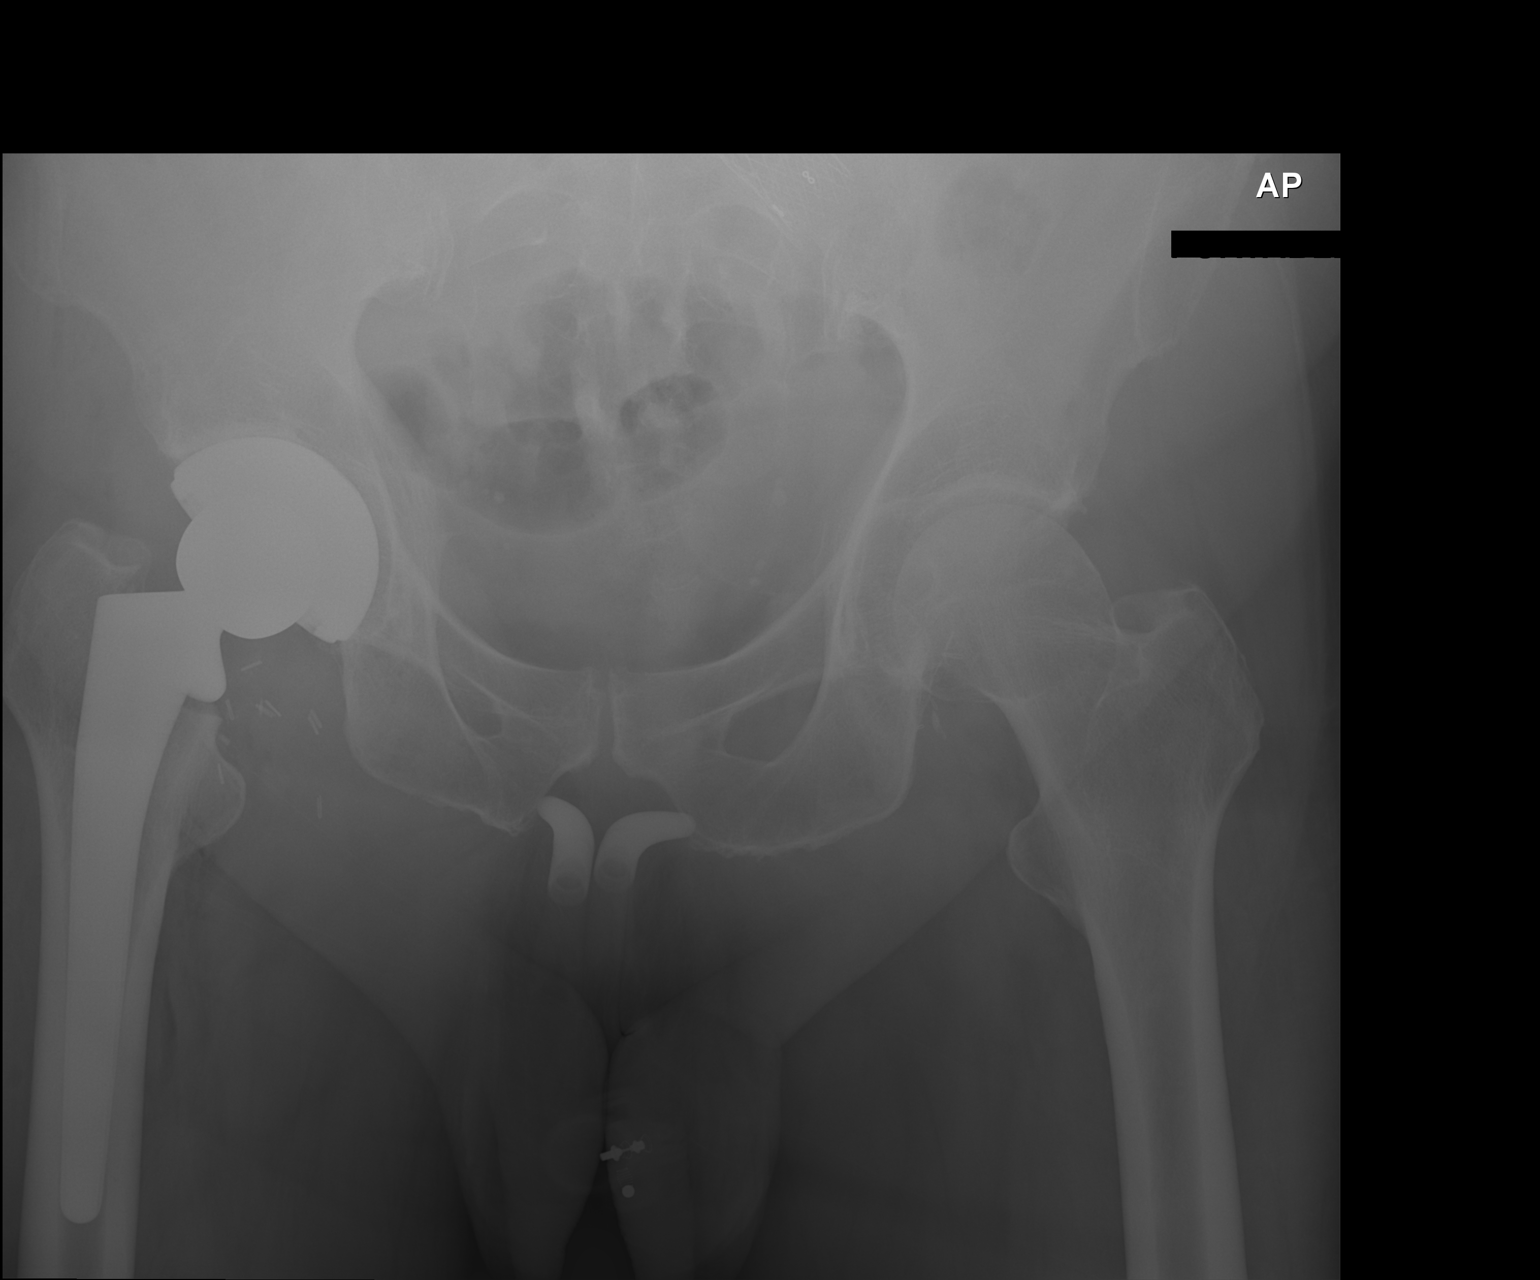

[1 of 1 positions shown; findings below may reference images not displayed]

FINDINGS: Single AP view of the pelvis submitted. There are immediate
postoperative changes of total right hip arthroplasty. Expected
locules of gas are seen adjacent to the proximal right femur. No
hardware complication or periprosthetic fracture is identified.
Penile prosthesis noted. Bilateral common iliac artery stents are
visualized. Surgical clips in the right groin.
IMPRESSION: Immediate postoperative changes of total right hip arthroplasty. No
acute complicating feature identified.

## 2015-06-05 DIAGNOSIS — G4700 Insomnia, unspecified: Secondary | ICD-10-CM | POA: Diagnosis not present

## 2015-06-05 DIAGNOSIS — M545 Low back pain: Secondary | ICD-10-CM | POA: Diagnosis not present

## 2015-06-05 DIAGNOSIS — F410 Panic disorder [episodic paroxysmal anxiety] without agoraphobia: Secondary | ICD-10-CM | POA: Diagnosis not present

## 2015-06-05 DIAGNOSIS — D039 Melanoma in situ, unspecified: Secondary | ICD-10-CM | POA: Diagnosis not present

## 2015-07-17 DIAGNOSIS — G4733 Obstructive sleep apnea (adult) (pediatric): Secondary | ICD-10-CM | POA: Diagnosis not present

## 2015-07-23 DIAGNOSIS — G4730 Sleep apnea, unspecified: Secondary | ICD-10-CM | POA: Diagnosis not present

## 2015-07-23 DIAGNOSIS — E7800 Pure hypercholesterolemia, unspecified: Secondary | ICD-10-CM | POA: Diagnosis not present

## 2015-07-23 DIAGNOSIS — Z87891 Personal history of nicotine dependence: Secondary | ICD-10-CM | POA: Diagnosis not present

## 2015-07-23 DIAGNOSIS — I2510 Atherosclerotic heart disease of native coronary artery without angina pectoris: Secondary | ICD-10-CM | POA: Diagnosis not present

## 2015-07-23 DIAGNOSIS — Z299 Encounter for prophylactic measures, unspecified: Secondary | ICD-10-CM | POA: Diagnosis not present

## 2015-07-23 DIAGNOSIS — M1990 Unspecified osteoarthritis, unspecified site: Secondary | ICD-10-CM | POA: Diagnosis not present

## 2015-07-31 DIAGNOSIS — E785 Hyperlipidemia, unspecified: Secondary | ICD-10-CM | POA: Diagnosis not present

## 2015-07-31 DIAGNOSIS — I2510 Atherosclerotic heart disease of native coronary artery without angina pectoris: Secondary | ICD-10-CM | POA: Diagnosis not present

## 2015-07-31 DIAGNOSIS — Z955 Presence of coronary angioplasty implant and graft: Secondary | ICD-10-CM | POA: Diagnosis not present

## 2015-07-31 DIAGNOSIS — Z9889 Other specified postprocedural states: Secondary | ICD-10-CM | POA: Diagnosis not present

## 2015-07-31 DIAGNOSIS — I340 Nonrheumatic mitral (valve) insufficiency: Secondary | ICD-10-CM | POA: Diagnosis not present

## 2015-08-05 DIAGNOSIS — Z48812 Encounter for surgical aftercare following surgery on the circulatory system: Secondary | ICD-10-CM | POA: Diagnosis not present

## 2015-08-05 DIAGNOSIS — I714 Abdominal aortic aneurysm, without rupture: Secondary | ICD-10-CM | POA: Diagnosis not present

## 2015-08-11 DIAGNOSIS — I2510 Atherosclerotic heart disease of native coronary artery without angina pectoris: Secondary | ICD-10-CM | POA: Diagnosis not present

## 2015-08-11 DIAGNOSIS — R0602 Shortness of breath: Secondary | ICD-10-CM | POA: Diagnosis not present

## 2015-08-24 DIAGNOSIS — S0990XA Unspecified injury of head, initial encounter: Secondary | ICD-10-CM | POA: Diagnosis not present

## 2015-09-01 DIAGNOSIS — I2510 Atherosclerotic heart disease of native coronary artery without angina pectoris: Secondary | ICD-10-CM | POA: Diagnosis not present

## 2015-09-01 DIAGNOSIS — M1990 Unspecified osteoarthritis, unspecified site: Secondary | ICD-10-CM | POA: Diagnosis not present

## 2015-09-01 DIAGNOSIS — M545 Low back pain: Secondary | ICD-10-CM | POA: Diagnosis not present

## 2015-09-11 DIAGNOSIS — M1990 Unspecified osteoarthritis, unspecified site: Secondary | ICD-10-CM | POA: Diagnosis not present

## 2015-09-11 DIAGNOSIS — S0990XA Unspecified injury of head, initial encounter: Secondary | ICD-10-CM | POA: Diagnosis not present

## 2015-09-11 DIAGNOSIS — S060X9A Concussion with loss of consciousness of unspecified duration, initial encounter: Secondary | ICD-10-CM | POA: Diagnosis not present

## 2015-09-11 DIAGNOSIS — S022XXA Fracture of nasal bones, initial encounter for closed fracture: Secondary | ICD-10-CM | POA: Diagnosis not present

## 2015-09-11 DIAGNOSIS — I2510 Atherosclerotic heart disease of native coronary artery without angina pectoris: Secondary | ICD-10-CM | POA: Diagnosis not present

## 2015-09-11 DIAGNOSIS — Z6824 Body mass index (BMI) 24.0-24.9, adult: Secondary | ICD-10-CM | POA: Diagnosis not present

## 2015-09-16 DIAGNOSIS — M159 Polyosteoarthritis, unspecified: Secondary | ICD-10-CM | POA: Diagnosis not present

## 2015-09-16 DIAGNOSIS — I2510 Atherosclerotic heart disease of native coronary artery without angina pectoris: Secondary | ICD-10-CM | POA: Diagnosis not present

## 2015-09-16 DIAGNOSIS — E7800 Pure hypercholesterolemia, unspecified: Secondary | ICD-10-CM | POA: Diagnosis not present

## 2015-12-10 DIAGNOSIS — Z1389 Encounter for screening for other disorder: Secondary | ICD-10-CM | POA: Diagnosis not present

## 2015-12-10 DIAGNOSIS — Z7189 Other specified counseling: Secondary | ICD-10-CM | POA: Diagnosis not present

## 2015-12-10 DIAGNOSIS — R5383 Other fatigue: Secondary | ICD-10-CM | POA: Diagnosis not present

## 2015-12-10 DIAGNOSIS — G4700 Insomnia, unspecified: Secondary | ICD-10-CM | POA: Diagnosis not present

## 2015-12-10 DIAGNOSIS — G4730 Sleep apnea, unspecified: Secondary | ICD-10-CM | POA: Diagnosis not present

## 2015-12-10 DIAGNOSIS — Z1211 Encounter for screening for malignant neoplasm of colon: Secondary | ICD-10-CM | POA: Diagnosis not present

## 2015-12-10 DIAGNOSIS — Z0000 Encounter for general adult medical examination without abnormal findings: Secondary | ICD-10-CM | POA: Diagnosis not present

## 2015-12-10 DIAGNOSIS — Z299 Encounter for prophylactic measures, unspecified: Secondary | ICD-10-CM | POA: Diagnosis not present

## 2015-12-10 DIAGNOSIS — E7800 Pure hypercholesterolemia, unspecified: Secondary | ICD-10-CM | POA: Diagnosis not present

## 2015-12-22 DIAGNOSIS — Z79899 Other long term (current) drug therapy: Secondary | ICD-10-CM | POA: Diagnosis not present

## 2015-12-22 DIAGNOSIS — R5383 Other fatigue: Secondary | ICD-10-CM | POA: Diagnosis not present

## 2015-12-22 DIAGNOSIS — G4730 Sleep apnea, unspecified: Secondary | ICD-10-CM | POA: Diagnosis not present

## 2015-12-22 DIAGNOSIS — Z125 Encounter for screening for malignant neoplasm of prostate: Secondary | ICD-10-CM | POA: Diagnosis not present

## 2015-12-22 DIAGNOSIS — E7800 Pure hypercholesterolemia, unspecified: Secondary | ICD-10-CM | POA: Diagnosis not present

## 2015-12-23 DIAGNOSIS — G4730 Sleep apnea, unspecified: Secondary | ICD-10-CM | POA: Diagnosis not present

## 2015-12-24 DIAGNOSIS — G4730 Sleep apnea, unspecified: Secondary | ICD-10-CM | POA: Diagnosis not present

## 2016-02-03 DIAGNOSIS — I714 Abdominal aortic aneurysm, without rupture: Secondary | ICD-10-CM | POA: Diagnosis not present

## 2016-02-03 DIAGNOSIS — Z48812 Encounter for surgical aftercare following surgery on the circulatory system: Secondary | ICD-10-CM | POA: Diagnosis not present

## 2016-03-11 DIAGNOSIS — F410 Panic disorder [episodic paroxysmal anxiety] without agoraphobia: Secondary | ICD-10-CM | POA: Diagnosis not present

## 2016-03-11 DIAGNOSIS — G4700 Insomnia, unspecified: Secondary | ICD-10-CM | POA: Diagnosis not present

## 2016-03-11 DIAGNOSIS — M545 Low back pain: Secondary | ICD-10-CM | POA: Diagnosis not present

## 2016-03-11 DIAGNOSIS — G4730 Sleep apnea, unspecified: Secondary | ICD-10-CM | POA: Diagnosis not present

## 2016-04-06 DIAGNOSIS — M159 Polyosteoarthritis, unspecified: Secondary | ICD-10-CM | POA: Diagnosis not present

## 2016-04-06 DIAGNOSIS — I2510 Atherosclerotic heart disease of native coronary artery without angina pectoris: Secondary | ICD-10-CM | POA: Diagnosis not present

## 2016-04-06 DIAGNOSIS — E7800 Pure hypercholesterolemia, unspecified: Secondary | ICD-10-CM | POA: Diagnosis not present

## 2016-05-21 DIAGNOSIS — M159 Polyosteoarthritis, unspecified: Secondary | ICD-10-CM | POA: Diagnosis not present

## 2016-05-21 DIAGNOSIS — I2510 Atherosclerotic heart disease of native coronary artery without angina pectoris: Secondary | ICD-10-CM | POA: Diagnosis not present

## 2016-05-21 DIAGNOSIS — E7800 Pure hypercholesterolemia, unspecified: Secondary | ICD-10-CM | POA: Diagnosis not present

## 2016-06-11 DIAGNOSIS — M549 Dorsalgia, unspecified: Secondary | ICD-10-CM | POA: Diagnosis not present

## 2016-06-11 DIAGNOSIS — G4700 Insomnia, unspecified: Secondary | ICD-10-CM | POA: Diagnosis not present

## 2016-07-12 DIAGNOSIS — M159 Polyosteoarthritis, unspecified: Secondary | ICD-10-CM | POA: Diagnosis not present

## 2016-07-12 DIAGNOSIS — E7800 Pure hypercholesterolemia, unspecified: Secondary | ICD-10-CM | POA: Diagnosis not present

## 2016-07-12 DIAGNOSIS — I2510 Atherosclerotic heart disease of native coronary artery without angina pectoris: Secondary | ICD-10-CM | POA: Diagnosis not present

## 2016-08-03 DIAGNOSIS — E7800 Pure hypercholesterolemia, unspecified: Secondary | ICD-10-CM | POA: Diagnosis not present

## 2016-08-03 DIAGNOSIS — M159 Polyosteoarthritis, unspecified: Secondary | ICD-10-CM | POA: Diagnosis not present

## 2016-08-03 DIAGNOSIS — I2510 Atherosclerotic heart disease of native coronary artery without angina pectoris: Secondary | ICD-10-CM | POA: Diagnosis not present

## 2016-08-09 DIAGNOSIS — G4730 Sleep apnea, unspecified: Secondary | ICD-10-CM | POA: Diagnosis not present

## 2016-08-09 DIAGNOSIS — F410 Panic disorder [episodic paroxysmal anxiety] without agoraphobia: Secondary | ICD-10-CM | POA: Diagnosis not present

## 2016-08-09 DIAGNOSIS — Z299 Encounter for prophylactic measures, unspecified: Secondary | ICD-10-CM | POA: Diagnosis not present

## 2016-08-09 DIAGNOSIS — E663 Overweight: Secondary | ICD-10-CM | POA: Diagnosis not present

## 2016-08-09 DIAGNOSIS — Z2821 Immunization not carried out because of patient refusal: Secondary | ICD-10-CM | POA: Diagnosis not present

## 2016-08-09 DIAGNOSIS — G4700 Insomnia, unspecified: Secondary | ICD-10-CM | POA: Diagnosis not present

## 2016-08-09 DIAGNOSIS — E039 Hypothyroidism, unspecified: Secondary | ICD-10-CM | POA: Diagnosis not present

## 2016-08-09 DIAGNOSIS — Z713 Dietary counseling and surveillance: Secondary | ICD-10-CM | POA: Diagnosis not present

## 2016-09-02 DIAGNOSIS — E7800 Pure hypercholesterolemia, unspecified: Secondary | ICD-10-CM | POA: Diagnosis not present

## 2016-09-02 DIAGNOSIS — H52209 Unspecified astigmatism, unspecified eye: Secondary | ICD-10-CM | POA: Diagnosis not present

## 2016-09-02 DIAGNOSIS — H2513 Age-related nuclear cataract, bilateral: Secondary | ICD-10-CM | POA: Diagnosis not present

## 2016-09-02 DIAGNOSIS — M159 Polyosteoarthritis, unspecified: Secondary | ICD-10-CM | POA: Diagnosis not present

## 2016-09-02 DIAGNOSIS — H353111 Nonexudative age-related macular degeneration, right eye, early dry stage: Secondary | ICD-10-CM | POA: Diagnosis not present

## 2016-09-02 DIAGNOSIS — I2510 Atherosclerotic heart disease of native coronary artery without angina pectoris: Secondary | ICD-10-CM | POA: Diagnosis not present

## 2016-09-02 DIAGNOSIS — H353122 Nonexudative age-related macular degeneration, left eye, intermediate dry stage: Secondary | ICD-10-CM | POA: Diagnosis not present

## 2016-09-22 DIAGNOSIS — E7800 Pure hypercholesterolemia, unspecified: Secondary | ICD-10-CM | POA: Diagnosis not present

## 2016-09-22 DIAGNOSIS — M159 Polyosteoarthritis, unspecified: Secondary | ICD-10-CM | POA: Diagnosis not present

## 2016-09-22 DIAGNOSIS — I2510 Atherosclerotic heart disease of native coronary artery without angina pectoris: Secondary | ICD-10-CM | POA: Diagnosis not present

## 2016-09-29 DIAGNOSIS — H353132 Nonexudative age-related macular degeneration, bilateral, intermediate dry stage: Secondary | ICD-10-CM | POA: Diagnosis not present

## 2016-09-29 DIAGNOSIS — H2513 Age-related nuclear cataract, bilateral: Secondary | ICD-10-CM | POA: Diagnosis not present

## 2016-11-09 DIAGNOSIS — I2510 Atherosclerotic heart disease of native coronary artery without angina pectoris: Secondary | ICD-10-CM | POA: Diagnosis not present

## 2016-11-09 DIAGNOSIS — E039 Hypothyroidism, unspecified: Secondary | ICD-10-CM | POA: Diagnosis not present

## 2016-11-09 DIAGNOSIS — Z299 Encounter for prophylactic measures, unspecified: Secondary | ICD-10-CM | POA: Diagnosis not present

## 2016-11-09 DIAGNOSIS — F410 Panic disorder [episodic paroxysmal anxiety] without agoraphobia: Secondary | ICD-10-CM | POA: Diagnosis not present

## 2016-11-09 DIAGNOSIS — G4730 Sleep apnea, unspecified: Secondary | ICD-10-CM | POA: Diagnosis not present

## 2016-11-09 DIAGNOSIS — Z6826 Body mass index (BMI) 26.0-26.9, adult: Secondary | ICD-10-CM | POA: Diagnosis not present

## 2016-11-16 DIAGNOSIS — H2511 Age-related nuclear cataract, right eye: Secondary | ICD-10-CM | POA: Diagnosis not present

## 2016-11-16 DIAGNOSIS — H269 Unspecified cataract: Secondary | ICD-10-CM | POA: Diagnosis not present

## 2016-11-17 DIAGNOSIS — H2512 Age-related nuclear cataract, left eye: Secondary | ICD-10-CM | POA: Diagnosis not present

## 2016-12-01 DIAGNOSIS — H269 Unspecified cataract: Secondary | ICD-10-CM | POA: Diagnosis not present

## 2016-12-01 DIAGNOSIS — H2512 Age-related nuclear cataract, left eye: Secondary | ICD-10-CM | POA: Diagnosis not present

## 2016-12-14 DIAGNOSIS — F410 Panic disorder [episodic paroxysmal anxiety] without agoraphobia: Secondary | ICD-10-CM | POA: Diagnosis not present

## 2016-12-14 DIAGNOSIS — Z1389 Encounter for screening for other disorder: Secondary | ICD-10-CM | POA: Diagnosis not present

## 2016-12-14 DIAGNOSIS — K219 Gastro-esophageal reflux disease without esophagitis: Secondary | ICD-10-CM | POA: Diagnosis not present

## 2016-12-14 DIAGNOSIS — Z6826 Body mass index (BMI) 26.0-26.9, adult: Secondary | ICD-10-CM | POA: Diagnosis not present

## 2016-12-14 DIAGNOSIS — Z299 Encounter for prophylactic measures, unspecified: Secondary | ICD-10-CM | POA: Diagnosis not present

## 2016-12-14 DIAGNOSIS — Z7189 Other specified counseling: Secondary | ICD-10-CM | POA: Diagnosis not present

## 2016-12-14 DIAGNOSIS — G4700 Insomnia, unspecified: Secondary | ICD-10-CM | POA: Diagnosis not present

## 2016-12-14 DIAGNOSIS — Z1211 Encounter for screening for malignant neoplasm of colon: Secondary | ICD-10-CM | POA: Diagnosis not present

## 2016-12-14 DIAGNOSIS — I2510 Atherosclerotic heart disease of native coronary artery without angina pectoris: Secondary | ICD-10-CM | POA: Diagnosis not present

## 2016-12-14 DIAGNOSIS — Z0000 Encounter for general adult medical examination without abnormal findings: Secondary | ICD-10-CM | POA: Diagnosis not present

## 2016-12-14 DIAGNOSIS — M1990 Unspecified osteoarthritis, unspecified site: Secondary | ICD-10-CM | POA: Diagnosis not present

## 2016-12-14 DIAGNOSIS — E7800 Pure hypercholesterolemia, unspecified: Secondary | ICD-10-CM | POA: Diagnosis not present

## 2016-12-16 DIAGNOSIS — E7800 Pure hypercholesterolemia, unspecified: Secondary | ICD-10-CM | POA: Diagnosis not present

## 2016-12-16 DIAGNOSIS — E039 Hypothyroidism, unspecified: Secondary | ICD-10-CM | POA: Diagnosis not present

## 2016-12-16 DIAGNOSIS — Z79899 Other long term (current) drug therapy: Secondary | ICD-10-CM | POA: Diagnosis not present

## 2016-12-16 DIAGNOSIS — Z125 Encounter for screening for malignant neoplasm of prostate: Secondary | ICD-10-CM | POA: Diagnosis not present

## 2017-02-04 DIAGNOSIS — M159 Polyosteoarthritis, unspecified: Secondary | ICD-10-CM | POA: Diagnosis not present

## 2017-02-04 DIAGNOSIS — I2510 Atherosclerotic heart disease of native coronary artery without angina pectoris: Secondary | ICD-10-CM | POA: Diagnosis not present

## 2017-02-04 DIAGNOSIS — E7800 Pure hypercholesterolemia, unspecified: Secondary | ICD-10-CM | POA: Diagnosis not present

## 2017-02-08 DIAGNOSIS — Z48812 Encounter for surgical aftercare following surgery on the circulatory system: Secondary | ICD-10-CM | POA: Diagnosis not present

## 2017-02-09 DIAGNOSIS — I2510 Atherosclerotic heart disease of native coronary artery without angina pectoris: Secondary | ICD-10-CM | POA: Diagnosis not present

## 2017-02-09 DIAGNOSIS — G4730 Sleep apnea, unspecified: Secondary | ICD-10-CM | POA: Diagnosis not present

## 2017-02-09 DIAGNOSIS — F410 Panic disorder [episodic paroxysmal anxiety] without agoraphobia: Secondary | ICD-10-CM | POA: Diagnosis not present

## 2017-02-09 DIAGNOSIS — Z299 Encounter for prophylactic measures, unspecified: Secondary | ICD-10-CM | POA: Diagnosis not present

## 2017-02-09 DIAGNOSIS — Z6826 Body mass index (BMI) 26.0-26.9, adult: Secondary | ICD-10-CM | POA: Diagnosis not present

## 2017-02-09 DIAGNOSIS — E7800 Pure hypercholesterolemia, unspecified: Secondary | ICD-10-CM | POA: Diagnosis not present

## 2017-02-09 DIAGNOSIS — E039 Hypothyroidism, unspecified: Secondary | ICD-10-CM | POA: Diagnosis not present

## 2017-05-02 DIAGNOSIS — E7800 Pure hypercholesterolemia, unspecified: Secondary | ICD-10-CM | POA: Diagnosis not present

## 2017-05-02 DIAGNOSIS — M159 Polyosteoarthritis, unspecified: Secondary | ICD-10-CM | POA: Diagnosis not present

## 2017-05-02 DIAGNOSIS — I2510 Atherosclerotic heart disease of native coronary artery without angina pectoris: Secondary | ICD-10-CM | POA: Diagnosis not present

## 2017-05-12 DIAGNOSIS — F410 Panic disorder [episodic paroxysmal anxiety] without agoraphobia: Secondary | ICD-10-CM | POA: Diagnosis not present

## 2017-05-12 DIAGNOSIS — E039 Hypothyroidism, unspecified: Secondary | ICD-10-CM | POA: Diagnosis not present

## 2017-05-12 DIAGNOSIS — Z6826 Body mass index (BMI) 26.0-26.9, adult: Secondary | ICD-10-CM | POA: Diagnosis not present

## 2017-05-12 DIAGNOSIS — I2510 Atherosclerotic heart disease of native coronary artery without angina pectoris: Secondary | ICD-10-CM | POA: Diagnosis not present

## 2017-05-12 DIAGNOSIS — G4700 Insomnia, unspecified: Secondary | ICD-10-CM | POA: Diagnosis not present

## 2017-05-12 DIAGNOSIS — Z299 Encounter for prophylactic measures, unspecified: Secondary | ICD-10-CM | POA: Diagnosis not present

## 2017-06-02 DIAGNOSIS — E7800 Pure hypercholesterolemia, unspecified: Secondary | ICD-10-CM | POA: Diagnosis not present

## 2017-06-02 DIAGNOSIS — I2510 Atherosclerotic heart disease of native coronary artery without angina pectoris: Secondary | ICD-10-CM | POA: Diagnosis not present

## 2017-06-02 DIAGNOSIS — M159 Polyosteoarthritis, unspecified: Secondary | ICD-10-CM | POA: Diagnosis not present

## 2017-07-11 DIAGNOSIS — H52209 Unspecified astigmatism, unspecified eye: Secondary | ICD-10-CM | POA: Diagnosis not present

## 2017-07-11 DIAGNOSIS — H353122 Nonexudative age-related macular degeneration, left eye, intermediate dry stage: Secondary | ICD-10-CM | POA: Diagnosis not present

## 2017-07-11 DIAGNOSIS — H353111 Nonexudative age-related macular degeneration, right eye, early dry stage: Secondary | ICD-10-CM | POA: Diagnosis not present

## 2017-07-11 DIAGNOSIS — Z961 Presence of intraocular lens: Secondary | ICD-10-CM | POA: Diagnosis not present

## 2017-07-15 DIAGNOSIS — M159 Polyosteoarthritis, unspecified: Secondary | ICD-10-CM | POA: Diagnosis not present

## 2017-07-15 DIAGNOSIS — I2510 Atherosclerotic heart disease of native coronary artery without angina pectoris: Secondary | ICD-10-CM | POA: Diagnosis not present

## 2017-07-15 DIAGNOSIS — E7800 Pure hypercholesterolemia, unspecified: Secondary | ICD-10-CM | POA: Diagnosis not present

## 2017-08-29 DIAGNOSIS — H353132 Nonexudative age-related macular degeneration, bilateral, intermediate dry stage: Secondary | ICD-10-CM | POA: Diagnosis not present

## 2017-09-08 DIAGNOSIS — I714 Abdominal aortic aneurysm, without rupture: Secondary | ICD-10-CM | POA: Diagnosis not present

## 2017-09-08 DIAGNOSIS — I2510 Atherosclerotic heart disease of native coronary artery without angina pectoris: Secondary | ICD-10-CM | POA: Diagnosis not present

## 2017-09-08 DIAGNOSIS — E538 Deficiency of other specified B group vitamins: Secondary | ICD-10-CM | POA: Diagnosis not present

## 2017-09-08 DIAGNOSIS — Z299 Encounter for prophylactic measures, unspecified: Secondary | ICD-10-CM | POA: Diagnosis not present

## 2017-09-08 DIAGNOSIS — E7800 Pure hypercholesterolemia, unspecified: Secondary | ICD-10-CM | POA: Diagnosis not present

## 2017-09-08 DIAGNOSIS — G4700 Insomnia, unspecified: Secondary | ICD-10-CM | POA: Diagnosis not present

## 2017-09-08 DIAGNOSIS — Z6826 Body mass index (BMI) 26.0-26.9, adult: Secondary | ICD-10-CM | POA: Diagnosis not present

## 2017-09-08 DIAGNOSIS — E039 Hypothyroidism, unspecified: Secondary | ICD-10-CM | POA: Diagnosis not present

## 2017-09-08 DIAGNOSIS — F410 Panic disorder [episodic paroxysmal anxiety] without agoraphobia: Secondary | ICD-10-CM | POA: Diagnosis not present

## 2017-09-08 DIAGNOSIS — G4730 Sleep apnea, unspecified: Secondary | ICD-10-CM | POA: Diagnosis not present

## 2017-10-05 DIAGNOSIS — R531 Weakness: Secondary | ICD-10-CM | POA: Diagnosis not present

## 2017-10-05 DIAGNOSIS — R0789 Other chest pain: Secondary | ICD-10-CM | POA: Diagnosis not present

## 2017-10-05 DIAGNOSIS — F410 Panic disorder [episodic paroxysmal anxiety] without agoraphobia: Secondary | ICD-10-CM | POA: Diagnosis not present

## 2017-10-05 DIAGNOSIS — R197 Diarrhea, unspecified: Secondary | ICD-10-CM | POA: Diagnosis not present

## 2017-10-05 DIAGNOSIS — I2510 Atherosclerotic heart disease of native coronary artery without angina pectoris: Secondary | ICD-10-CM | POA: Diagnosis not present

## 2017-10-05 DIAGNOSIS — E538 Deficiency of other specified B group vitamins: Secondary | ICD-10-CM | POA: Diagnosis not present

## 2017-10-05 DIAGNOSIS — F419 Anxiety disorder, unspecified: Secondary | ICD-10-CM | POA: Diagnosis not present

## 2017-10-05 DIAGNOSIS — S00211A Abrasion of right eyelid and periocular area, initial encounter: Secondary | ICD-10-CM | POA: Diagnosis not present

## 2017-10-05 DIAGNOSIS — E785 Hyperlipidemia, unspecified: Secondary | ICD-10-CM | POA: Diagnosis not present

## 2017-10-05 DIAGNOSIS — Z96641 Presence of right artificial hip joint: Secondary | ICD-10-CM | POA: Diagnosis not present

## 2017-10-05 DIAGNOSIS — R609 Edema, unspecified: Secondary | ICD-10-CM | POA: Diagnosis not present

## 2017-10-05 DIAGNOSIS — R110 Nausea: Secondary | ICD-10-CM | POA: Diagnosis not present

## 2017-10-05 DIAGNOSIS — Z87891 Personal history of nicotine dependence: Secondary | ICD-10-CM | POA: Diagnosis not present

## 2017-10-05 DIAGNOSIS — Z8489 Family history of other specified conditions: Secondary | ICD-10-CM | POA: Diagnosis not present

## 2017-10-05 DIAGNOSIS — S0990XA Unspecified injury of head, initial encounter: Secondary | ICD-10-CM | POA: Diagnosis not present

## 2017-10-05 DIAGNOSIS — Z7902 Long term (current) use of antithrombotics/antiplatelets: Secondary | ICD-10-CM | POA: Diagnosis not present

## 2017-10-05 DIAGNOSIS — K219 Gastro-esophageal reflux disease without esophagitis: Secondary | ICD-10-CM | POA: Diagnosis not present

## 2017-10-05 DIAGNOSIS — S2242XA Multiple fractures of ribs, left side, initial encounter for closed fracture: Secondary | ICD-10-CM | POA: Diagnosis not present

## 2017-10-05 DIAGNOSIS — R4182 Altered mental status, unspecified: Secondary | ICD-10-CM | POA: Diagnosis not present

## 2017-10-05 DIAGNOSIS — Z8249 Family history of ischemic heart disease and other diseases of the circulatory system: Secondary | ICD-10-CM | POA: Diagnosis not present

## 2017-10-05 DIAGNOSIS — R410 Disorientation, unspecified: Secondary | ICD-10-CM | POA: Diagnosis not present

## 2017-10-05 DIAGNOSIS — G4730 Sleep apnea, unspecified: Secondary | ICD-10-CM | POA: Diagnosis not present

## 2017-10-05 DIAGNOSIS — Z7982 Long term (current) use of aspirin: Secondary | ICD-10-CM | POA: Diagnosis not present

## 2017-10-05 DIAGNOSIS — R262 Difficulty in walking, not elsewhere classified: Secondary | ICD-10-CM | POA: Diagnosis not present

## 2017-10-05 DIAGNOSIS — Z955 Presence of coronary angioplasty implant and graft: Secondary | ICD-10-CM | POA: Diagnosis not present

## 2017-10-05 DIAGNOSIS — G4700 Insomnia, unspecified: Secondary | ICD-10-CM | POA: Diagnosis not present

## 2017-10-05 DIAGNOSIS — Z79899 Other long term (current) drug therapy: Secondary | ICD-10-CM | POA: Diagnosis not present

## 2017-10-05 DIAGNOSIS — R079 Chest pain, unspecified: Secondary | ICD-10-CM | POA: Diagnosis not present

## 2017-10-05 DIAGNOSIS — R159 Full incontinence of feces: Secondary | ICD-10-CM | POA: Diagnosis not present

## 2017-10-06 DIAGNOSIS — I700 Atherosclerosis of aorta: Secondary | ICD-10-CM | POA: Diagnosis not present

## 2017-10-06 DIAGNOSIS — S2242XA Multiple fractures of ribs, left side, initial encounter for closed fracture: Secondary | ICD-10-CM | POA: Diagnosis not present

## 2017-10-06 DIAGNOSIS — R410 Disorientation, unspecified: Secondary | ICD-10-CM | POA: Diagnosis not present

## 2017-10-06 DIAGNOSIS — R4182 Altered mental status, unspecified: Secondary | ICD-10-CM | POA: Diagnosis not present

## 2017-10-06 DIAGNOSIS — R7989 Other specified abnormal findings of blood chemistry: Secondary | ICD-10-CM | POA: Diagnosis not present

## 2017-10-06 DIAGNOSIS — W11XXXA Fall on and from ladder, initial encounter: Secondary | ICD-10-CM | POA: Diagnosis not present

## 2017-10-06 DIAGNOSIS — S2232XA Fracture of one rib, left side, initial encounter for closed fracture: Secondary | ICD-10-CM | POA: Diagnosis not present

## 2017-10-07 DIAGNOSIS — R410 Disorientation, unspecified: Secondary | ICD-10-CM | POA: Diagnosis not present

## 2017-10-07 DIAGNOSIS — R4182 Altered mental status, unspecified: Secondary | ICD-10-CM | POA: Diagnosis not present

## 2017-10-07 DIAGNOSIS — S2242XA Multiple fractures of ribs, left side, initial encounter for closed fracture: Secondary | ICD-10-CM | POA: Diagnosis not present

## 2017-10-07 DIAGNOSIS — W11XXXA Fall on and from ladder, initial encounter: Secondary | ICD-10-CM | POA: Diagnosis not present

## 2017-10-12 DIAGNOSIS — R0789 Other chest pain: Secondary | ICD-10-CM | POA: Diagnosis not present

## 2017-10-12 DIAGNOSIS — Z6826 Body mass index (BMI) 26.0-26.9, adult: Secondary | ICD-10-CM | POA: Diagnosis not present

## 2017-10-12 DIAGNOSIS — I714 Abdominal aortic aneurysm, without rupture: Secondary | ICD-10-CM | POA: Diagnosis not present

## 2017-10-12 DIAGNOSIS — I2510 Atherosclerotic heart disease of native coronary artery without angina pectoris: Secondary | ICD-10-CM | POA: Diagnosis not present

## 2017-10-12 DIAGNOSIS — Z299 Encounter for prophylactic measures, unspecified: Secondary | ICD-10-CM | POA: Diagnosis not present

## 2017-10-12 DIAGNOSIS — Z09 Encounter for follow-up examination after completed treatment for conditions other than malignant neoplasm: Secondary | ICD-10-CM | POA: Diagnosis not present

## 2017-10-12 DIAGNOSIS — R600 Localized edema: Secondary | ICD-10-CM | POA: Diagnosis not present

## 2017-11-09 DIAGNOSIS — R600 Localized edema: Secondary | ICD-10-CM | POA: Diagnosis not present

## 2017-11-09 DIAGNOSIS — I2510 Atherosclerotic heart disease of native coronary artery without angina pectoris: Secondary | ICD-10-CM | POA: Diagnosis not present

## 2017-11-09 DIAGNOSIS — E7800 Pure hypercholesterolemia, unspecified: Secondary | ICD-10-CM | POA: Diagnosis not present

## 2017-11-09 DIAGNOSIS — Z299 Encounter for prophylactic measures, unspecified: Secondary | ICD-10-CM | POA: Diagnosis not present

## 2017-11-09 DIAGNOSIS — Z79899 Other long term (current) drug therapy: Secondary | ICD-10-CM | POA: Diagnosis not present

## 2017-11-09 DIAGNOSIS — Z6826 Body mass index (BMI) 26.0-26.9, adult: Secondary | ICD-10-CM | POA: Diagnosis not present

## 2017-12-09 DIAGNOSIS — M546 Pain in thoracic spine: Secondary | ICD-10-CM | POA: Diagnosis not present

## 2017-12-09 DIAGNOSIS — M545 Low back pain: Secondary | ICD-10-CM | POA: Diagnosis not present

## 2017-12-09 DIAGNOSIS — S22080A Wedge compression fracture of T11-T12 vertebra, initial encounter for closed fracture: Secondary | ICD-10-CM | POA: Diagnosis not present

## 2017-12-20 DIAGNOSIS — M4854XA Collapsed vertebra, not elsewhere classified, thoracic region, initial encounter for fracture: Secondary | ICD-10-CM | POA: Diagnosis not present

## 2017-12-20 DIAGNOSIS — M47894 Other spondylosis, thoracic region: Secondary | ICD-10-CM | POA: Diagnosis not present

## 2017-12-20 DIAGNOSIS — M810 Age-related osteoporosis without current pathological fracture: Secondary | ICD-10-CM | POA: Diagnosis not present

## 2017-12-20 DIAGNOSIS — S22089A Unspecified fracture of T11-T12 vertebra, initial encounter for closed fracture: Secondary | ICD-10-CM | POA: Diagnosis not present

## 2017-12-20 DIAGNOSIS — M545 Low back pain: Secondary | ICD-10-CM | POA: Diagnosis not present

## 2018-01-04 DIAGNOSIS — S22080A Wedge compression fracture of T11-T12 vertebra, initial encounter for closed fracture: Secondary | ICD-10-CM | POA: Diagnosis not present

## 2018-01-04 DIAGNOSIS — M546 Pain in thoracic spine: Secondary | ICD-10-CM | POA: Diagnosis not present

## 2018-01-05 DIAGNOSIS — E7800 Pure hypercholesterolemia, unspecified: Secondary | ICD-10-CM | POA: Diagnosis not present

## 2018-01-05 DIAGNOSIS — I2510 Atherosclerotic heart disease of native coronary artery without angina pectoris: Secondary | ICD-10-CM | POA: Diagnosis not present

## 2018-01-05 DIAGNOSIS — M159 Polyosteoarthritis, unspecified: Secondary | ICD-10-CM | POA: Diagnosis not present

## 2018-02-02 DIAGNOSIS — E7800 Pure hypercholesterolemia, unspecified: Secondary | ICD-10-CM | POA: Diagnosis not present

## 2018-02-02 DIAGNOSIS — I2510 Atherosclerotic heart disease of native coronary artery without angina pectoris: Secondary | ICD-10-CM | POA: Diagnosis not present

## 2018-02-02 DIAGNOSIS — M159 Polyosteoarthritis, unspecified: Secondary | ICD-10-CM | POA: Diagnosis not present

## 2018-02-07 DIAGNOSIS — G4730 Sleep apnea, unspecified: Secondary | ICD-10-CM | POA: Diagnosis not present

## 2018-02-07 DIAGNOSIS — E039 Hypothyroidism, unspecified: Secondary | ICD-10-CM | POA: Diagnosis not present

## 2018-02-07 DIAGNOSIS — Z6826 Body mass index (BMI) 26.0-26.9, adult: Secondary | ICD-10-CM | POA: Diagnosis not present

## 2018-02-07 DIAGNOSIS — F410 Panic disorder [episodic paroxysmal anxiety] without agoraphobia: Secondary | ICD-10-CM | POA: Diagnosis not present

## 2018-02-07 DIAGNOSIS — R600 Localized edema: Secondary | ICD-10-CM | POA: Diagnosis not present

## 2018-02-07 DIAGNOSIS — Z299 Encounter for prophylactic measures, unspecified: Secondary | ICD-10-CM | POA: Diagnosis not present

## 2018-02-13 DIAGNOSIS — E039 Hypothyroidism, unspecified: Secondary | ICD-10-CM | POA: Diagnosis not present

## 2018-02-13 DIAGNOSIS — E7800 Pure hypercholesterolemia, unspecified: Secondary | ICD-10-CM | POA: Diagnosis not present

## 2018-02-13 DIAGNOSIS — Z23 Encounter for immunization: Secondary | ICD-10-CM | POA: Diagnosis not present

## 2018-02-13 DIAGNOSIS — Z6826 Body mass index (BMI) 26.0-26.9, adult: Secondary | ICD-10-CM | POA: Diagnosis not present

## 2018-02-13 DIAGNOSIS — Z125 Encounter for screening for malignant neoplasm of prostate: Secondary | ICD-10-CM | POA: Diagnosis not present

## 2018-02-13 DIAGNOSIS — Z7189 Other specified counseling: Secondary | ICD-10-CM | POA: Diagnosis not present

## 2018-02-13 DIAGNOSIS — Z1211 Encounter for screening for malignant neoplasm of colon: Secondary | ICD-10-CM | POA: Diagnosis not present

## 2018-02-13 DIAGNOSIS — R5383 Other fatigue: Secondary | ICD-10-CM | POA: Diagnosis not present

## 2018-02-13 DIAGNOSIS — Z299 Encounter for prophylactic measures, unspecified: Secondary | ICD-10-CM | POA: Diagnosis not present

## 2018-02-13 DIAGNOSIS — Z0000 Encounter for general adult medical examination without abnormal findings: Secondary | ICD-10-CM | POA: Diagnosis not present

## 2018-02-13 DIAGNOSIS — Z1331 Encounter for screening for depression: Secondary | ICD-10-CM | POA: Diagnosis not present

## 2018-02-13 DIAGNOSIS — Z79899 Other long term (current) drug therapy: Secondary | ICD-10-CM | POA: Diagnosis not present

## 2018-02-13 DIAGNOSIS — Z1339 Encounter for screening examination for other mental health and behavioral disorders: Secondary | ICD-10-CM | POA: Diagnosis not present

## 2018-03-06 DIAGNOSIS — S22080D Wedge compression fracture of T11-T12 vertebra, subsequent encounter for fracture with routine healing: Secondary | ICD-10-CM | POA: Diagnosis not present

## 2018-03-06 DIAGNOSIS — S22080A Wedge compression fracture of T11-T12 vertebra, initial encounter for closed fracture: Secondary | ICD-10-CM | POA: Diagnosis not present

## 2018-03-06 DIAGNOSIS — S32010D Wedge compression fracture of first lumbar vertebra, subsequent encounter for fracture with routine healing: Secondary | ICD-10-CM | POA: Diagnosis not present

## 2018-03-06 DIAGNOSIS — M546 Pain in thoracic spine: Secondary | ICD-10-CM | POA: Diagnosis not present

## 2018-03-08 DIAGNOSIS — R195 Other fecal abnormalities: Secondary | ICD-10-CM | POA: Diagnosis not present

## 2018-03-17 DIAGNOSIS — I2510 Atherosclerotic heart disease of native coronary artery without angina pectoris: Secondary | ICD-10-CM | POA: Diagnosis not present

## 2018-03-17 DIAGNOSIS — E7800 Pure hypercholesterolemia, unspecified: Secondary | ICD-10-CM | POA: Diagnosis not present

## 2018-03-17 DIAGNOSIS — M159 Polyosteoarthritis, unspecified: Secondary | ICD-10-CM | POA: Diagnosis not present

## 2018-03-23 DIAGNOSIS — S1980XA Other specified injuries of unspecified part of neck, initial encounter: Secondary | ICD-10-CM | POA: Diagnosis not present

## 2018-03-23 DIAGNOSIS — R404 Transient alteration of awareness: Secondary | ICD-10-CM | POA: Diagnosis not present

## 2018-03-23 DIAGNOSIS — R410 Disorientation, unspecified: Secondary | ICD-10-CM | POA: Diagnosis not present

## 2018-03-23 DIAGNOSIS — S0990XA Unspecified injury of head, initial encounter: Secondary | ICD-10-CM | POA: Diagnosis not present

## 2018-03-23 DIAGNOSIS — S0101XA Laceration without foreign body of scalp, initial encounter: Secondary | ICD-10-CM | POA: Diagnosis not present

## 2018-03-23 DIAGNOSIS — R58 Hemorrhage, not elsewhere classified: Secondary | ICD-10-CM | POA: Diagnosis not present

## 2018-03-23 DIAGNOSIS — F17210 Nicotine dependence, cigarettes, uncomplicated: Secondary | ICD-10-CM | POA: Diagnosis not present

## 2018-03-23 DIAGNOSIS — S0191XA Laceration without foreign body of unspecified part of head, initial encounter: Secondary | ICD-10-CM | POA: Diagnosis not present

## 2018-03-23 DIAGNOSIS — Z9889 Other specified postprocedural states: Secondary | ICD-10-CM | POA: Diagnosis not present

## 2018-03-29 DIAGNOSIS — I10 Essential (primary) hypertension: Secondary | ICD-10-CM | POA: Diagnosis not present

## 2018-03-29 DIAGNOSIS — M5416 Radiculopathy, lumbar region: Secondary | ICD-10-CM | POA: Diagnosis not present

## 2018-03-29 DIAGNOSIS — G4733 Obstructive sleep apnea (adult) (pediatric): Secondary | ICD-10-CM | POA: Diagnosis not present

## 2018-03-29 DIAGNOSIS — I2510 Atherosclerotic heart disease of native coronary artery without angina pectoris: Secondary | ICD-10-CM | POA: Diagnosis not present

## 2018-03-29 DIAGNOSIS — F419 Anxiety disorder, unspecified: Secondary | ICD-10-CM | POA: Diagnosis not present

## 2018-03-29 DIAGNOSIS — Z79899 Other long term (current) drug therapy: Secondary | ICD-10-CM | POA: Diagnosis not present

## 2018-03-29 DIAGNOSIS — E039 Hypothyroidism, unspecified: Secondary | ICD-10-CM | POA: Diagnosis not present

## 2018-04-03 DIAGNOSIS — G4730 Sleep apnea, unspecified: Secondary | ICD-10-CM | POA: Diagnosis not present

## 2018-04-03 DIAGNOSIS — K621 Rectal polyp: Secondary | ICD-10-CM | POA: Diagnosis not present

## 2018-04-03 DIAGNOSIS — K921 Melena: Secondary | ICD-10-CM | POA: Diagnosis not present

## 2018-04-03 DIAGNOSIS — K6289 Other specified diseases of anus and rectum: Secondary | ICD-10-CM | POA: Diagnosis not present

## 2018-04-03 DIAGNOSIS — Z7902 Long term (current) use of antithrombotics/antiplatelets: Secondary | ICD-10-CM | POA: Diagnosis not present

## 2018-04-03 DIAGNOSIS — E039 Hypothyroidism, unspecified: Secondary | ICD-10-CM | POA: Diagnosis not present

## 2018-04-03 DIAGNOSIS — D126 Benign neoplasm of colon, unspecified: Secondary | ICD-10-CM | POA: Diagnosis not present

## 2018-04-03 DIAGNOSIS — D125 Benign neoplasm of sigmoid colon: Secondary | ICD-10-CM | POA: Diagnosis not present

## 2018-04-03 DIAGNOSIS — F17200 Nicotine dependence, unspecified, uncomplicated: Secondary | ICD-10-CM | POA: Diagnosis not present

## 2018-04-03 DIAGNOSIS — D122 Benign neoplasm of ascending colon: Secondary | ICD-10-CM | POA: Diagnosis not present

## 2018-04-03 DIAGNOSIS — Z955 Presence of coronary angioplasty implant and graft: Secondary | ICD-10-CM | POA: Diagnosis not present

## 2018-04-03 DIAGNOSIS — Z79899 Other long term (current) drug therapy: Secondary | ICD-10-CM | POA: Diagnosis not present

## 2018-04-03 DIAGNOSIS — I2510 Atherosclerotic heart disease of native coronary artery without angina pectoris: Secondary | ICD-10-CM | POA: Diagnosis not present

## 2018-04-03 DIAGNOSIS — R197 Diarrhea, unspecified: Secondary | ICD-10-CM | POA: Diagnosis not present

## 2018-04-03 DIAGNOSIS — K5730 Diverticulosis of large intestine without perforation or abscess without bleeding: Secondary | ICD-10-CM | POA: Diagnosis not present

## 2018-04-03 DIAGNOSIS — K219 Gastro-esophageal reflux disease without esophagitis: Secondary | ICD-10-CM | POA: Diagnosis not present

## 2018-04-03 DIAGNOSIS — R195 Other fecal abnormalities: Secondary | ICD-10-CM | POA: Diagnosis not present

## 2018-04-03 DIAGNOSIS — K635 Polyp of colon: Secondary | ICD-10-CM | POA: Diagnosis not present

## 2018-04-04 DIAGNOSIS — S0101XD Laceration without foreign body of scalp, subsequent encounter: Secondary | ICD-10-CM | POA: Diagnosis not present

## 2018-04-04 DIAGNOSIS — Z79899 Other long term (current) drug therapy: Secondary | ICD-10-CM | POA: Diagnosis not present

## 2018-04-04 DIAGNOSIS — Z4802 Encounter for removal of sutures: Secondary | ICD-10-CM | POA: Diagnosis not present

## 2018-04-04 DIAGNOSIS — F17210 Nicotine dependence, cigarettes, uncomplicated: Secondary | ICD-10-CM | POA: Diagnosis not present

## 2018-04-10 DIAGNOSIS — S0101XD Laceration without foreign body of scalp, subsequent encounter: Secondary | ICD-10-CM | POA: Diagnosis not present

## 2018-04-10 DIAGNOSIS — Z09 Encounter for follow-up examination after completed treatment for conditions other than malignant neoplasm: Secondary | ICD-10-CM | POA: Diagnosis not present

## 2018-04-10 DIAGNOSIS — Z9889 Other specified postprocedural states: Secondary | ICD-10-CM | POA: Diagnosis not present

## 2018-04-13 DIAGNOSIS — G4733 Obstructive sleep apnea (adult) (pediatric): Secondary | ICD-10-CM | POA: Diagnosis not present

## 2018-04-13 DIAGNOSIS — S22080A Wedge compression fracture of T11-T12 vertebra, initial encounter for closed fracture: Secondary | ICD-10-CM | POA: Diagnosis not present

## 2018-04-13 DIAGNOSIS — I10 Essential (primary) hypertension: Secondary | ICD-10-CM | POA: Diagnosis not present

## 2018-04-13 DIAGNOSIS — S22000A Wedge compression fracture of unspecified thoracic vertebra, initial encounter for closed fracture: Secondary | ICD-10-CM | POA: Diagnosis not present

## 2018-04-13 DIAGNOSIS — K219 Gastro-esophageal reflux disease without esophagitis: Secondary | ICD-10-CM | POA: Diagnosis not present

## 2018-04-13 DIAGNOSIS — Z01810 Encounter for preprocedural cardiovascular examination: Secondary | ICD-10-CM | POA: Diagnosis not present

## 2018-04-13 DIAGNOSIS — M4855XA Collapsed vertebra, not elsewhere classified, thoracolumbar region, initial encounter for fracture: Secondary | ICD-10-CM | POA: Diagnosis not present

## 2018-04-13 DIAGNOSIS — E039 Hypothyroidism, unspecified: Secondary | ICD-10-CM | POA: Diagnosis not present

## 2018-04-13 DIAGNOSIS — S32010A Wedge compression fracture of first lumbar vertebra, initial encounter for closed fracture: Secondary | ICD-10-CM | POA: Diagnosis not present

## 2018-04-13 DIAGNOSIS — I2510 Atherosclerotic heart disease of native coronary artery without angina pectoris: Secondary | ICD-10-CM | POA: Diagnosis not present

## 2018-04-13 DIAGNOSIS — S22080G Wedge compression fracture of T11-T12 vertebra, subsequent encounter for fracture with delayed healing: Secondary | ICD-10-CM | POA: Diagnosis not present

## 2018-04-17 DIAGNOSIS — D126 Benign neoplasm of colon, unspecified: Secondary | ICD-10-CM | POA: Diagnosis not present

## 2018-04-21 DIAGNOSIS — J209 Acute bronchitis, unspecified: Secondary | ICD-10-CM | POA: Diagnosis not present

## 2018-04-21 DIAGNOSIS — I517 Cardiomegaly: Secondary | ICD-10-CM | POA: Diagnosis not present

## 2018-04-21 DIAGNOSIS — R05 Cough: Secondary | ICD-10-CM | POA: Diagnosis not present

## 2018-04-21 DIAGNOSIS — Z299 Encounter for prophylactic measures, unspecified: Secondary | ICD-10-CM | POA: Diagnosis not present

## 2018-04-21 DIAGNOSIS — Z6826 Body mass index (BMI) 26.0-26.9, adult: Secondary | ICD-10-CM | POA: Diagnosis not present

## 2018-04-21 DIAGNOSIS — F17210 Nicotine dependence, cigarettes, uncomplicated: Secondary | ICD-10-CM | POA: Diagnosis not present

## 2018-04-21 DIAGNOSIS — G4730 Sleep apnea, unspecified: Secondary | ICD-10-CM | POA: Diagnosis not present

## 2018-05-18 DIAGNOSIS — I714 Abdominal aortic aneurysm, without rupture: Secondary | ICD-10-CM | POA: Diagnosis not present

## 2018-05-18 DIAGNOSIS — F17200 Nicotine dependence, unspecified, uncomplicated: Secondary | ICD-10-CM | POA: Diagnosis not present

## 2018-05-18 DIAGNOSIS — Z48812 Encounter for surgical aftercare following surgery on the circulatory system: Secondary | ICD-10-CM | POA: Diagnosis not present

## 2018-05-18 DIAGNOSIS — Z6830 Body mass index (BMI) 30.0-30.9, adult: Secondary | ICD-10-CM | POA: Diagnosis not present

## 2018-05-25 DIAGNOSIS — I2510 Atherosclerotic heart disease of native coronary artery without angina pectoris: Secondary | ICD-10-CM | POA: Diagnosis not present

## 2018-05-25 DIAGNOSIS — E7800 Pure hypercholesterolemia, unspecified: Secondary | ICD-10-CM | POA: Diagnosis not present

## 2018-05-25 DIAGNOSIS — M159 Polyosteoarthritis, unspecified: Secondary | ICD-10-CM | POA: Diagnosis not present

## 2018-05-29 DIAGNOSIS — E039 Hypothyroidism, unspecified: Secondary | ICD-10-CM | POA: Diagnosis not present

## 2018-05-29 DIAGNOSIS — S66127A Laceration of flexor muscle, fascia and tendon of left little finger at wrist and hand level, initial encounter: Secondary | ICD-10-CM | POA: Diagnosis not present

## 2018-05-29 DIAGNOSIS — Z23 Encounter for immunization: Secondary | ICD-10-CM | POA: Diagnosis not present

## 2018-05-29 DIAGNOSIS — G4730 Sleep apnea, unspecified: Secondary | ICD-10-CM | POA: Diagnosis not present

## 2018-05-29 DIAGNOSIS — I2510 Atherosclerotic heart disease of native coronary artery without angina pectoris: Secondary | ICD-10-CM | POA: Diagnosis not present

## 2018-05-29 DIAGNOSIS — S56128A Laceration of flexor muscle, fascia and tendon of left little finger at forearm level, initial encounter: Secondary | ICD-10-CM | POA: Diagnosis not present

## 2018-05-29 DIAGNOSIS — K219 Gastro-esophageal reflux disease without esophagitis: Secondary | ICD-10-CM | POA: Diagnosis not present

## 2018-05-29 DIAGNOSIS — Z955 Presence of coronary angioplasty implant and graft: Secondary | ICD-10-CM | POA: Diagnosis not present

## 2018-05-29 DIAGNOSIS — Z299 Encounter for prophylactic measures, unspecified: Secondary | ICD-10-CM | POA: Diagnosis not present

## 2018-05-29 DIAGNOSIS — Z6825 Body mass index (BMI) 25.0-25.9, adult: Secondary | ICD-10-CM | POA: Diagnosis not present

## 2018-05-29 DIAGNOSIS — F17210 Nicotine dependence, cigarettes, uncomplicated: Secondary | ICD-10-CM | POA: Diagnosis not present

## 2018-05-29 DIAGNOSIS — S61409A Unspecified open wound of unspecified hand, initial encounter: Secondary | ICD-10-CM | POA: Diagnosis not present

## 2018-05-29 DIAGNOSIS — S61217A Laceration without foreign body of left little finger without damage to nail, initial encounter: Secondary | ICD-10-CM | POA: Diagnosis not present

## 2018-05-29 DIAGNOSIS — Z79899 Other long term (current) drug therapy: Secondary | ICD-10-CM | POA: Diagnosis not present

## 2018-05-29 DIAGNOSIS — S61207A Unspecified open wound of left little finger without damage to nail, initial encounter: Secondary | ICD-10-CM | POA: Diagnosis not present

## 2018-05-29 DIAGNOSIS — W25XXXA Contact with sharp glass, initial encounter: Secondary | ICD-10-CM | POA: Diagnosis not present

## 2018-05-29 DIAGNOSIS — Z7902 Long term (current) use of antithrombotics/antiplatelets: Secondary | ICD-10-CM | POA: Diagnosis not present

## 2018-05-30 DIAGNOSIS — S56129A Laceration of flexor muscle, fascia and tendon of unspecified finger at forearm level, initial encounter: Secondary | ICD-10-CM | POA: Diagnosis not present

## 2018-06-02 DIAGNOSIS — X58XXXA Exposure to other specified factors, initial encounter: Secondary | ICD-10-CM | POA: Diagnosis not present

## 2018-06-02 DIAGNOSIS — S65517A Laceration of blood vessel of left little finger, initial encounter: Secondary | ICD-10-CM | POA: Diagnosis not present

## 2018-06-02 DIAGNOSIS — S64497A Injury of digital nerve of left little finger, initial encounter: Secondary | ICD-10-CM | POA: Diagnosis not present

## 2018-06-02 DIAGNOSIS — G8918 Other acute postprocedural pain: Secondary | ICD-10-CM | POA: Diagnosis not present

## 2018-06-02 DIAGNOSIS — S66127A Laceration of flexor muscle, fascia and tendon of left little finger at wrist and hand level, initial encounter: Secondary | ICD-10-CM | POA: Diagnosis not present

## 2018-06-02 DIAGNOSIS — Y999 Unspecified external cause status: Secondary | ICD-10-CM | POA: Diagnosis not present

## 2018-06-06 DIAGNOSIS — M79645 Pain in left finger(s): Secondary | ICD-10-CM | POA: Diagnosis not present

## 2018-06-06 DIAGNOSIS — M25642 Stiffness of left hand, not elsewhere classified: Secondary | ICD-10-CM | POA: Diagnosis not present

## 2018-06-13 DIAGNOSIS — M25642 Stiffness of left hand, not elsewhere classified: Secondary | ICD-10-CM | POA: Diagnosis not present

## 2018-06-27 DIAGNOSIS — E7800 Pure hypercholesterolemia, unspecified: Secondary | ICD-10-CM | POA: Diagnosis not present

## 2018-06-27 DIAGNOSIS — M159 Polyosteoarthritis, unspecified: Secondary | ICD-10-CM | POA: Diagnosis not present

## 2018-06-27 DIAGNOSIS — I2510 Atherosclerotic heart disease of native coronary artery without angina pectoris: Secondary | ICD-10-CM | POA: Diagnosis not present

## 2018-07-04 DIAGNOSIS — M25642 Stiffness of left hand, not elsewhere classified: Secondary | ICD-10-CM | POA: Diagnosis not present

## 2018-07-20 DIAGNOSIS — I2510 Atherosclerotic heart disease of native coronary artery without angina pectoris: Secondary | ICD-10-CM | POA: Diagnosis not present

## 2018-07-20 DIAGNOSIS — Z955 Presence of coronary angioplasty implant and graft: Secondary | ICD-10-CM | POA: Diagnosis not present

## 2018-07-20 DIAGNOSIS — S2232XA Fracture of one rib, left side, initial encounter for closed fracture: Secondary | ICD-10-CM | POA: Diagnosis not present

## 2018-07-20 DIAGNOSIS — S3993XA Unspecified injury of pelvis, initial encounter: Secondary | ICD-10-CM | POA: Diagnosis not present

## 2018-07-20 DIAGNOSIS — W1839XA Other fall on same level, initial encounter: Secondary | ICD-10-CM | POA: Diagnosis not present

## 2018-07-20 DIAGNOSIS — F17200 Nicotine dependence, unspecified, uncomplicated: Secondary | ICD-10-CM | POA: Diagnosis not present

## 2018-07-20 DIAGNOSIS — S20212A Contusion of left front wall of thorax, initial encounter: Secondary | ICD-10-CM | POA: Diagnosis not present

## 2018-07-20 DIAGNOSIS — S0990XA Unspecified injury of head, initial encounter: Secondary | ICD-10-CM | POA: Diagnosis not present

## 2018-07-20 DIAGNOSIS — R102 Pelvic and perineal pain: Secondary | ICD-10-CM | POA: Diagnosis not present

## 2018-08-01 DIAGNOSIS — M159 Polyosteoarthritis, unspecified: Secondary | ICD-10-CM | POA: Diagnosis not present

## 2018-08-01 DIAGNOSIS — E7800 Pure hypercholesterolemia, unspecified: Secondary | ICD-10-CM | POA: Diagnosis not present

## 2018-08-01 DIAGNOSIS — I2510 Atherosclerotic heart disease of native coronary artery without angina pectoris: Secondary | ICD-10-CM | POA: Diagnosis not present

## 2018-08-24 DIAGNOSIS — Z299 Encounter for prophylactic measures, unspecified: Secondary | ICD-10-CM | POA: Diagnosis not present

## 2018-08-24 DIAGNOSIS — E039 Hypothyroidism, unspecified: Secondary | ICD-10-CM | POA: Diagnosis not present

## 2018-08-24 DIAGNOSIS — F410 Panic disorder [episodic paroxysmal anxiety] without agoraphobia: Secondary | ICD-10-CM | POA: Diagnosis not present

## 2018-08-24 DIAGNOSIS — G4700 Insomnia, unspecified: Secondary | ICD-10-CM | POA: Diagnosis not present

## 2018-08-24 DIAGNOSIS — I2510 Atherosclerotic heart disease of native coronary artery without angina pectoris: Secondary | ICD-10-CM | POA: Diagnosis not present

## 2018-09-18 DIAGNOSIS — M159 Polyosteoarthritis, unspecified: Secondary | ICD-10-CM | POA: Diagnosis not present

## 2018-09-18 DIAGNOSIS — I2510 Atherosclerotic heart disease of native coronary artery without angina pectoris: Secondary | ICD-10-CM | POA: Diagnosis not present

## 2018-09-18 DIAGNOSIS — E7800 Pure hypercholesterolemia, unspecified: Secondary | ICD-10-CM | POA: Diagnosis not present

## 2018-10-17 DIAGNOSIS — G4700 Insomnia, unspecified: Secondary | ICD-10-CM | POA: Diagnosis not present

## 2018-10-17 DIAGNOSIS — Z299 Encounter for prophylactic measures, unspecified: Secondary | ICD-10-CM | POA: Diagnosis not present

## 2018-10-17 DIAGNOSIS — F410 Panic disorder [episodic paroxysmal anxiety] without agoraphobia: Secondary | ICD-10-CM | POA: Diagnosis not present

## 2018-10-17 DIAGNOSIS — Z713 Dietary counseling and surveillance: Secondary | ICD-10-CM | POA: Diagnosis not present

## 2018-10-20 DIAGNOSIS — I2510 Atherosclerotic heart disease of native coronary artery without angina pectoris: Secondary | ICD-10-CM | POA: Diagnosis not present

## 2018-10-20 DIAGNOSIS — M159 Polyosteoarthritis, unspecified: Secondary | ICD-10-CM | POA: Diagnosis not present

## 2018-10-20 DIAGNOSIS — E7800 Pure hypercholesterolemia, unspecified: Secondary | ICD-10-CM | POA: Diagnosis not present

## 2018-11-03 DIAGNOSIS — Z6823 Body mass index (BMI) 23.0-23.9, adult: Secondary | ICD-10-CM | POA: Diagnosis not present

## 2018-11-03 DIAGNOSIS — G4730 Sleep apnea, unspecified: Secondary | ICD-10-CM | POA: Diagnosis not present

## 2018-11-03 DIAGNOSIS — F410 Panic disorder [episodic paroxysmal anxiety] without agoraphobia: Secondary | ICD-10-CM | POA: Diagnosis not present

## 2018-11-03 DIAGNOSIS — G4700 Insomnia, unspecified: Secondary | ICD-10-CM | POA: Diagnosis not present

## 2018-11-03 DIAGNOSIS — Z299 Encounter for prophylactic measures, unspecified: Secondary | ICD-10-CM | POA: Diagnosis not present

## 2018-11-03 DIAGNOSIS — M545 Low back pain: Secondary | ICD-10-CM | POA: Diagnosis not present

## 2018-11-14 DIAGNOSIS — M159 Polyosteoarthritis, unspecified: Secondary | ICD-10-CM | POA: Diagnosis not present

## 2018-11-14 DIAGNOSIS — I2510 Atherosclerotic heart disease of native coronary artery without angina pectoris: Secondary | ICD-10-CM | POA: Diagnosis not present

## 2018-11-14 DIAGNOSIS — E7800 Pure hypercholesterolemia, unspecified: Secondary | ICD-10-CM | POA: Diagnosis not present

## 2018-12-15 DIAGNOSIS — I2510 Atherosclerotic heart disease of native coronary artery without angina pectoris: Secondary | ICD-10-CM | POA: Diagnosis not present

## 2018-12-15 DIAGNOSIS — E7800 Pure hypercholesterolemia, unspecified: Secondary | ICD-10-CM | POA: Diagnosis not present

## 2018-12-15 DIAGNOSIS — M159 Polyosteoarthritis, unspecified: Secondary | ICD-10-CM | POA: Diagnosis not present

## 2018-12-23 DIAGNOSIS — J189 Pneumonia, unspecified organism: Secondary | ICD-10-CM | POA: Diagnosis not present

## 2018-12-23 DIAGNOSIS — Z96641 Presence of right artificial hip joint: Secondary | ICD-10-CM | POA: Diagnosis not present

## 2018-12-23 DIAGNOSIS — R102 Pelvic and perineal pain: Secondary | ICD-10-CM | POA: Diagnosis not present

## 2018-12-23 DIAGNOSIS — Z720 Tobacco use: Secondary | ICD-10-CM | POA: Diagnosis not present

## 2018-12-23 DIAGNOSIS — W19XXXA Unspecified fall, initial encounter: Secondary | ICD-10-CM | POA: Diagnosis not present

## 2018-12-23 DIAGNOSIS — S299XXA Unspecified injury of thorax, initial encounter: Secondary | ICD-10-CM | POA: Diagnosis not present

## 2018-12-23 DIAGNOSIS — S3993XA Unspecified injury of pelvis, initial encounter: Secondary | ICD-10-CM | POA: Diagnosis not present

## 2018-12-23 DIAGNOSIS — S0990XA Unspecified injury of head, initial encounter: Secondary | ICD-10-CM | POA: Diagnosis not present

## 2018-12-24 DIAGNOSIS — J1289 Other viral pneumonia: Secondary | ICD-10-CM | POA: Diagnosis not present

## 2018-12-24 DIAGNOSIS — U071 COVID-19: Secondary | ICD-10-CM | POA: Diagnosis not present

## 2018-12-24 DIAGNOSIS — J9601 Acute respiratory failure with hypoxia: Secondary | ICD-10-CM | POA: Diagnosis not present

## 2018-12-24 DIAGNOSIS — J189 Pneumonia, unspecified organism: Secondary | ICD-10-CM | POA: Diagnosis not present

## 2018-12-25 DIAGNOSIS — J189 Pneumonia, unspecified organism: Secondary | ICD-10-CM | POA: Diagnosis not present

## 2018-12-25 DIAGNOSIS — J1289 Other viral pneumonia: Secondary | ICD-10-CM | POA: Diagnosis not present

## 2018-12-25 DIAGNOSIS — U071 COVID-19: Secondary | ICD-10-CM | POA: Diagnosis not present

## 2018-12-25 DIAGNOSIS — J9601 Acute respiratory failure with hypoxia: Secondary | ICD-10-CM | POA: Diagnosis not present

## 2018-12-26 DIAGNOSIS — J189 Pneumonia, unspecified organism: Secondary | ICD-10-CM | POA: Diagnosis not present

## 2018-12-26 DIAGNOSIS — J1289 Other viral pneumonia: Secondary | ICD-10-CM | POA: Diagnosis not present

## 2018-12-26 DIAGNOSIS — J9601 Acute respiratory failure with hypoxia: Secondary | ICD-10-CM | POA: Diagnosis not present

## 2018-12-26 DIAGNOSIS — U071 COVID-19: Secondary | ICD-10-CM | POA: Diagnosis not present

## 2018-12-27 ENCOUNTER — Inpatient Hospital Stay (HOSPITAL_COMMUNITY)
Admission: AD | Admit: 2018-12-27 | Discharge: 2019-01-13 | DRG: 177 | Disposition: A | Discharge status: Skilled Nursing Facility | Payer: Medicare Other | Source: Other Acute Inpatient Hospital | Attending: Internal Medicine | Admitting: Internal Medicine

## 2018-12-27 DIAGNOSIS — E039 Hypothyroidism, unspecified: Secondary | ICD-10-CM | POA: Diagnosis present

## 2018-12-27 DIAGNOSIS — E538 Deficiency of other specified B group vitamins: Secondary | ICD-10-CM | POA: Diagnosis not present

## 2018-12-27 DIAGNOSIS — D539 Nutritional anemia, unspecified: Secondary | ICD-10-CM | POA: Diagnosis not present

## 2018-12-27 DIAGNOSIS — G4730 Sleep apnea, unspecified: Secondary | ICD-10-CM | POA: Diagnosis present

## 2018-12-27 DIAGNOSIS — Z7902 Long term (current) use of antithrombotics/antiplatelets: Secondary | ICD-10-CM | POA: Diagnosis not present

## 2018-12-27 DIAGNOSIS — F1010 Alcohol abuse, uncomplicated: Secondary | ICD-10-CM | POA: Diagnosis not present

## 2018-12-27 DIAGNOSIS — U071 COVID-19: Secondary | ICD-10-CM | POA: Diagnosis not present

## 2018-12-27 DIAGNOSIS — J9601 Acute respiratory failure with hypoxia: Secondary | ICD-10-CM | POA: Diagnosis present

## 2018-12-27 DIAGNOSIS — Z7982 Long term (current) use of aspirin: Secondary | ICD-10-CM

## 2018-12-27 DIAGNOSIS — Z79899 Other long term (current) drug therapy: Secondary | ICD-10-CM

## 2018-12-27 DIAGNOSIS — Z7952 Long term (current) use of systemic steroids: Secondary | ICD-10-CM

## 2018-12-27 DIAGNOSIS — R296 Repeated falls: Secondary | ICD-10-CM | POA: Diagnosis not present

## 2018-12-27 DIAGNOSIS — R278 Other lack of coordination: Secondary | ICD-10-CM | POA: Diagnosis not present

## 2018-12-27 DIAGNOSIS — M6281 Muscle weakness (generalized): Secondary | ICD-10-CM | POA: Diagnosis not present

## 2018-12-27 DIAGNOSIS — J069 Acute upper respiratory infection, unspecified: Secondary | ICD-10-CM | POA: Diagnosis not present

## 2018-12-27 DIAGNOSIS — F17210 Nicotine dependence, cigarettes, uncomplicated: Secondary | ICD-10-CM | POA: Diagnosis present

## 2018-12-27 DIAGNOSIS — R2681 Unsteadiness on feet: Secondary | ICD-10-CM | POA: Diagnosis not present

## 2018-12-27 DIAGNOSIS — K219 Gastro-esophageal reflux disease without esophagitis: Secondary | ICD-10-CM | POA: Diagnosis present

## 2018-12-27 DIAGNOSIS — J1289 Other viral pneumonia: Secondary | ICD-10-CM | POA: Diagnosis not present

## 2018-12-27 DIAGNOSIS — Z96641 Presence of right artificial hip joint: Secondary | ICD-10-CM | POA: Diagnosis present

## 2018-12-27 DIAGNOSIS — F1020 Alcohol dependence, uncomplicated: Secondary | ICD-10-CM | POA: Diagnosis present

## 2018-12-27 DIAGNOSIS — M1990 Unspecified osteoarthritis, unspecified site: Secondary | ICD-10-CM | POA: Diagnosis present

## 2018-12-27 DIAGNOSIS — Z8679 Personal history of other diseases of the circulatory system: Secondary | ICD-10-CM

## 2018-12-27 DIAGNOSIS — G9341 Metabolic encephalopathy: Secondary | ICD-10-CM | POA: Diagnosis not present

## 2018-12-27 DIAGNOSIS — E038 Other specified hypothyroidism: Secondary | ICD-10-CM | POA: Diagnosis not present

## 2018-12-27 DIAGNOSIS — I472 Ventricular tachycardia: Secondary | ICD-10-CM | POA: Diagnosis not present

## 2018-12-27 DIAGNOSIS — E0781 Sick-euthyroid syndrome: Secondary | ICD-10-CM | POA: Diagnosis present

## 2018-12-27 DIAGNOSIS — Z833 Family history of diabetes mellitus: Secondary | ICD-10-CM

## 2018-12-27 DIAGNOSIS — Z955 Presence of coronary angioplasty implant and graft: Secondary | ICD-10-CM | POA: Diagnosis not present

## 2018-12-27 DIAGNOSIS — R2689 Other abnormalities of gait and mobility: Secondary | ICD-10-CM | POA: Diagnosis not present

## 2018-12-27 DIAGNOSIS — Z79890 Hormone replacement therapy (postmenopausal): Secondary | ICD-10-CM

## 2018-12-27 DIAGNOSIS — J189 Pneumonia, unspecified organism: Secondary | ICD-10-CM | POA: Diagnosis not present

## 2018-12-27 DIAGNOSIS — I2510 Atherosclerotic heart disease of native coronary artery without angina pectoris: Secondary | ICD-10-CM | POA: Diagnosis not present

## 2018-12-27 LAB — C-REACTIVE PROTEIN: CRP: 1.1 mg/dL — ABNORMAL HIGH (ref <1.0)

## 2018-12-27 LAB — ABO/RH: ABO/RH(D): A NEG

## 2018-12-27 MED ORDER — SODIUM CHLORIDE 0.9% FLUSH: 3.0000 mL | INTRAVENOUS | Status: DC | PRN

## 2018-12-27 MED ORDER — METHYLPREDNISOLONE SODIUM SUCC 125 MG IJ SOLR
60.0000 mg | Freq: Two times a day (BID) | INTRAMUSCULAR | Status: DC
  Administered 2018-12-27 – 2018-12-30 (×6): 60 mg via INTRAVENOUS
  Filled 2018-12-27 (×7): qty 2

## 2018-12-27 MED ORDER — SODIUM CHLORIDE 0.9 % IV SOLN
250.0000 mL | INTRAVENOUS | Status: DC | PRN
  Administered 2018-12-28: 250 mL via INTRAVENOUS

## 2018-12-27 MED ORDER — SODIUM CHLORIDE 0.9 % IV SOLN
100.0000 mg | INTRAVENOUS | Status: AC
  Administered 2018-12-28 – 2018-12-31 (×4): 100 mg via INTRAVENOUS
  Filled 2018-12-27 (×4): qty 20

## 2018-12-27 MED ORDER — ZINC SULFATE 220 (50 ZN) MG PO CAPS
220.0000 mg | ORAL_CAPSULE | Freq: Every day | ORAL | Status: DC
  Administered 2018-12-27 – 2019-01-13 (×18): 220 mg via ORAL
  Filled 2018-12-27 (×18): qty 1

## 2018-12-27 MED ORDER — NICOTINE 14 MG/24HR TD PT24
14.0000 mg | MEDICATED_PATCH | Freq: Every day | TRANSDERMAL | Status: DC
  Administered 2018-12-27 – 2019-01-13 (×18): 14 mg via TRANSDERMAL
  Filled 2018-12-27 (×20): qty 1

## 2018-12-27 MED ORDER — ACETAMINOPHEN 325 MG PO TABS
650.0000 mg | ORAL_TABLET | Freq: Four times a day (QID) | ORAL | Status: DC | PRN
  Administered 2018-12-31: 650 mg via ORAL
  Filled 2018-12-27: qty 2

## 2018-12-27 MED ORDER — SODIUM CHLORIDE 0.9% FLUSH
3.0000 mL | Freq: Two times a day (BID) | INTRAVENOUS | Status: DC
  Administered 2018-12-28 – 2018-12-29 (×4): 3 mL via INTRAVENOUS

## 2018-12-27 MED ORDER — SODIUM CHLORIDE 0.9% FLUSH
3.0000 mL | Freq: Two times a day (BID) | INTRAVENOUS | Status: DC
  Administered 2018-12-28 – 2019-01-13 (×28): 3 mL via INTRAVENOUS

## 2018-12-27 MED ORDER — VITAMIN B-12 1000 MCG PO TABS
500.0000 ug | ORAL_TABLET | Freq: Every day | ORAL | Status: DC
  Administered 2018-12-28 – 2019-01-13 (×17): 500 ug via ORAL
  Filled 2018-12-27 (×18): qty 1

## 2018-12-27 MED ORDER — THIAMINE HCL 100 MG/ML IJ SOLN: 100.0000 mg | Freq: Every day | INTRAMUSCULAR | Status: DC

## 2018-12-27 MED ORDER — PANTOPRAZOLE SODIUM 40 MG PO TBEC
40.0000 mg | DELAYED_RELEASE_TABLET | Freq: Every day | ORAL | Status: DC
  Administered 2018-12-27 – 2019-01-13 (×18): 40 mg via ORAL
  Filled 2018-12-27 (×18): qty 1

## 2018-12-27 MED ORDER — ENOXAPARIN SODIUM 40 MG/0.4ML ~~LOC~~ SOLN
40.0000 mg | SUBCUTANEOUS | Status: DC
  Administered 2018-12-27: 40 mg via SUBCUTANEOUS
  Filled 2018-12-27: qty 0.4

## 2018-12-27 MED ORDER — POLYETHYLENE GLYCOL 3350 17 G PO PACK
17.0000 g | PACK | Freq: Every day | ORAL | Status: DC | PRN
  Administered 2019-01-09 – 2019-01-11 (×2): 17 g via ORAL
  Filled 2018-12-27 (×2): qty 1

## 2018-12-27 MED ORDER — ASPIRIN EC 81 MG PO TBEC
81.0000 mg | DELAYED_RELEASE_TABLET | Freq: Every day | ORAL | Status: DC
  Administered 2018-12-28 – 2019-01-13 (×17): 81 mg via ORAL
  Filled 2018-12-27 (×19): qty 1

## 2018-12-27 MED ORDER — ADULT MULTIVITAMIN W/MINERALS CH
1.0000 | ORAL_TABLET | Freq: Every day | ORAL | Status: DC
  Administered 2018-12-27 – 2019-01-13 (×18): 1 via ORAL
  Filled 2018-12-27 (×18): qty 1

## 2018-12-27 MED ORDER — LEVOTHYROXINE SODIUM 75 MCG PO TABS
150.0000 ug | ORAL_TABLET | Freq: Every day | ORAL | Status: DC
  Administered 2018-12-28 – 2019-01-13 (×17): 150 ug via ORAL
  Filled 2018-12-27 (×17): qty 2

## 2018-12-27 MED ORDER — SODIUM CHLORIDE 0.9 % IV SOLN
200.0000 mg | Freq: Once | INTRAVENOUS | Status: AC
  Administered 2018-12-27: 200 mg via INTRAVENOUS
  Filled 2018-12-27: qty 40

## 2018-12-27 MED ORDER — FOLIC ACID 1 MG PO TABS
1.0000 mg | ORAL_TABLET | Freq: Every day | ORAL | Status: DC
  Administered 2018-12-27 – 2019-01-13 (×18): 1 mg via ORAL
  Filled 2018-12-27 (×18): qty 1

## 2018-12-27 MED ORDER — GUAIFENESIN-DM 100-10 MG/5ML PO SYRP
10.0000 mL | ORAL_SOLUTION | ORAL | Status: DC | PRN
  Administered 2018-12-28 – 2019-01-03 (×2): 10 mL via ORAL
  Filled 2018-12-27 (×2): qty 10

## 2018-12-27 MED ORDER — CLOPIDOGREL BISULFATE 75 MG PO TABS
75.0000 mg | ORAL_TABLET | Freq: Every day | ORAL | Status: DC
  Administered 2018-12-27 – 2019-01-13 (×18): 75 mg via ORAL
  Filled 2018-12-27 (×18): qty 1

## 2018-12-27 MED ORDER — VITAMIN B-1 100 MG PO TABS
100.0000 mg | ORAL_TABLET | Freq: Every day | ORAL | Status: DC
  Administered 2018-12-27 – 2019-01-13 (×18): 100 mg via ORAL
  Filled 2018-12-27 (×18): qty 1

## 2018-12-27 MED ORDER — LORAZEPAM 0.5 MG PO TABS
1.0000 mg | ORAL_TABLET | Freq: Four times a day (QID) | ORAL | Status: AC | PRN
  Administered 2018-12-28: 1 mg via ORAL
  Filled 2018-12-27: qty 2

## 2018-12-27 MED ORDER — IPRATROPIUM-ALBUTEROL 20-100 MCG/ACT IN AERS
1.0000 | INHALATION_SPRAY | Freq: Four times a day (QID) | RESPIRATORY_TRACT | Status: DC
  Administered 2018-12-27 – 2019-01-13 (×60): 1 via RESPIRATORY_TRACT
  Filled 2018-12-27: qty 4

## 2018-12-27 MED ORDER — VITAMIN C 500 MG PO TABS
500.0000 mg | ORAL_TABLET | Freq: Every day | ORAL | Status: DC
  Administered 2018-12-27 – 2019-01-13 (×18): 500 mg via ORAL
  Filled 2018-12-27 (×18): qty 1

## 2018-12-27 MED ORDER — LORAZEPAM 2 MG/ML IJ SOLN: 1.0000 mg | Freq: Four times a day (QID) | INTRAMUSCULAR | Status: DC | PRN

## 2018-12-27 MED ORDER — SENNA 8.6 MG PO TABS
1.0000 | ORAL_TABLET | Freq: Two times a day (BID) | ORAL | Status: DC
  Administered 2018-12-27 – 2019-01-13 (×31): 8.6 mg via ORAL
  Filled 2018-12-27 (×33): qty 1

## 2018-12-27 NOTE — Progress Notes (Signed)
Spoke with dtr Rivka Safer [phone]) to inform family of pts arrival to [facility]. Updates given, questions answered, appreciation verbalized

## 2018-12-27 NOTE — H&P (Signed)
[facility] History and Physical  Billy Byrd DJM:[id number] DOB: 09-Mar-1941 DOA: 12/27/2018   PCP: Orpah Cobb., MD  Specialists: Followed by cardiologist, Dr. Ashby Dawes, in [place]  Chief Complaint: Shortness of breath and confusion  HPI: Billy Byrd is a 78 y.o. male with a past medical history of coronary artery disease, status post PCI more than 2 years ago, on aspirin and Plavix, history of hypothyroidism, history of alcohol abuse, history of tobacco abuse who has been living with a sister for the past 1 month.  It is not clear why he moved in with his sister but apparently has been having frequent falls and has not been able to take care of himself, presumably due to his issues with alcoholism.  Patient apparently goes to a "club" where he consumes alcohol.  It is thought that that is where he contracted COVID-19.  He has been sick for about 5 to 6 days.  He was taken to the hospital in [facility] on Saturday and was hospitalized.  He was started on antibacterial agents.  Subsequently the COVID-19 test came back as being positive.  His oxygenation has worsened over the last 24 hours.  He was initially on 2 L of oxygen subsequently went to 6 L and then 10 L this morning.  Chest x-ray was repeated this morning which showed worsening bilateral infiltrates.  He was subsequently transferred over to this facility for further management.  Patient is distracted.  He denies any chest pain.  Denies any shortness of breath.  He seems to be comfortable lying on the bed.  History is limited due to his confusion.  Home Medications: This is an old medication list.  To be reconciled. Prior to Admission medications   Medication Sig Start Date End Date Taking? Authorizing Provider  aspirin 81 MG tablet Take 81 mg by mouth daily.    [provider]  calcium carbonate 200 MG capsule Take 1,000 mg by mouth 1 day or 1 dose.    [provider]  Calcium  Carbonate-Vitamin D (CALCIUM-VITAMIN D) 500-200 MG-UNIT per tablet Take 1 tablet by mouth daily.    [provider]  clopidogrel (PLAVIX) 75 MG tablet Take 75 mg by mouth daily.    [provider]  cyanocobalamin (,VITAMIN B-12,) 1000 MCG/ML injection Inject 1,000 mcg into the muscle every 30 (thirty) days. First of each month    [provider]  esomeprazole (NEXIUM) 40 MG capsule Take 40 mg by mouth daily at 12 noon.    [provider]  Fish Oil-Cholecalciferol (FISH OIL + D3) 1000-1000 MG-UNIT CAPS Take 1,000 Units by mouth 1 day or 1 dose.    [provider]  levothyroxine (SYNTHROID, LEVOTHROID) 150 MCG tablet Take 150 mcg by mouth every other day. Take 150 mcg every other day, alternating with 175 mcg    [provider]  levothyroxine (SYNTHROID, LEVOTHROID) 175 MCG tablet Take 175 mcg by mouth every other day. Take 175 mcg every other day alternating with 150 mcg    [provider]  oxyCODONE-acetaminophen (PERCOCET/ROXICET) 5-325 MG per tablet Take 1-2 tablets by mouth every 6 (six) hours as needed for severe pain. 01/14/14   Gary Fleet, PA-C    Allergies: No Known Allergies  Past Medical History: Past Medical History:  Diagnosis Date   Aortic aneurysm    stent done 6 yrs ago   Arthritis    Coronary artery disease    GERD (gastroesophageal reflux disease)  Hypothyroidism    Sleep apnea    cpap used since 04    Past Surgical History:  Procedure Laterality Date   ABDOMINAL AORTIC ANEURYSM REPAIR     6 yrs ago stent   BACK SURGERY  84,10   HERNIA REPAIR     bilateral inguinal   TOTAL HIP ARTHROPLASTY Right 01/14/2014   Procedure: RIGHT TOTAL HIP ARTHROPLASTY ANTERIOR APPROACH;  Surgeon: Alta Corning, MD;  Location: [facility];  Service: Orthopedics;  Laterality: Right;    Social History: Lives with his sister.  She smokes greater than 1 pack of cigarettes on a daily basis.  Drinks 2-3 mixed drinks every  day.  However he has been in a hospital for the last 4 days.   Family History:   History of diabetes in the family.  Not specified any further  Review of Systems -unable to do due to his mental confusion  Physical Examination  Vitals:   12/27/18 1600  BP: 112/63  Temp: 97.8 F (36.6 C)  TempSrc: Oral  Weight: 93 kg  Height: 6\' 5"  (1.956 m)    BP 112/63 (BP Location: Left Arm)    Temp 97.8 F (36.6 C) (Oral)    Ht 6\' 5"  (1.956 m)    Wt 93 kg    BMI 24.31 kg/m   General appearance: alert, cooperative, distracted and no distress Head: Normocephalic, without obvious abnormality, atraumatic Eyes: conjunctivae/corneas clear. PERRL, EOM's intact.  Throat: lips, mucosa, and tongue normal; teeth and gums normal Neck: no adenopathy, no carotid bruit, no JVD, supple, symmetrical, trachea midline and thyroid not enlarged, symmetric, no tenderness/mass/nodules Resp: Coarse breath sounds bilaterally.  Mildly tachypneic at rest.  Crackles at the bases bilaterally.  No wheezing or rhonchi. Cardio: regular rate and rhythm, S1, S2 normal, no murmur, click, rub or gallop GI: soft, non-tender; bowel sounds normal; no masses,  no organomegaly Extremities: extremities normal, atraumatic, no cyanosis or edema Pulses: 2+ and symmetric Skin: Skin color, texture, turgor normal. No rashes or lesions Lymph nodes: Cervical, supraclavicular, and axillary nodes normal. Neurologic: He is alert.  He is disoriented to place and time.  No facial asymmetry.  Motor strength is equal bilateral upper and lower extremities.   Labs on Admission: Labs from outside facility reviewed, results as below  Na 135 k 3.7 Cl 96 CO2 25  Glucose 115, Bun 13, Creat 1.07  AST 42, Alt 12, Bili 0.5  Trop 0.01  inr 1.1 bnp 102  D dimer 5.59  Plat 225   Radiological Exams on Admission: Chest x-ray done at the outside facility earlier today was reported as showing worsening extensive bilateral airspace opacity most  focal in the left midlung.  My interpretation of Electrocardiogram: Will be ordered   Problem List  Principal Problem:   Acute respiratory disease due to COVID-19 virus Active Problems:   Acute respiratory failure with hypoxia (HCC)   Acute metabolic encephalopathy   Alcohol abuse   CAD (coronary artery disease)   Hypothyroidism   Assessment: This is a 78 year old Caucasian male with a past medical history as stated earlier who presented to the hospital in [facility] on Saturday with shortness of breath.  He was noted to have a pneumonia.  He was also confused.  He was hospitalized there.  Subsequently found to be positive for COVID-19.  His mental confusion started improving.  However his oxygenation worsened.  He has pneumonia secondary to COVID-19.  He has acute respiratory failure with hypoxia.  Plan:  Acute respiratory failure with hypoxia due to COVID-19  Fever: Afebrile here Oxygen requirements: On HFNC at 10 L/min Antibacterials: He was on ceftriaxone and azithromycin at the outside facility Remdesivir: Will be initiated Steroids: Solu-Medrol 60 mg every 12 hours Diuretics: Will not order it for now Actemra: Will be considered based on CRP Vitamin C and Zinc: Will be ordered DVT Prophylaxis:  Lovenox 40 mg daily  Research Studies: Not enrolled into any research studies yet  Patient with worsening oxygenation over the last 24 hours.  He went from requiring 2 L of oxygen to 10 L of oxygen.  He is however stable.  He does not have increased work of breathing.  Patient has been on ceftriaxone and azithromycin since his hospitalization at [facility].  Hold off on antibacterials for now.  We will check procalcitonin levels tomorrow.  Patient is hypoxic and has bilateral infiltrates.  We will order Remdesivir.  He will be given steroids as well.  We will order inflammatory markers and consider Actemra.  This has been discussed with patient's brother who is the  healthcare power of attorney.  Wean down oxygen as tolerated.  Awake prone positioning as tolerated.  Incentive spirometry, mobilization.  PT and OT evaluation.  D-dimer is noted to be elevated.  He will be placed on DVT prophylaxis dose of Lovenox for now.  Trend d-dimer.  The treatment plan and use of medications and known side effects were discussed with family (patient unable to participate due to encephalopathy).  Some of the medications used are based on case reports/anecdotal data which are not peer-reviewed and has not been studied using randomized control trials.  Complete risks and long-term side effects are unknown, however in the best clinical judgment they seem to be of some benefit.  Family agrees with the treatment plan and want to receive these treatments as indicated.  His brother who is the healthcare power of attorney is agreeable to Actemra if needed.  Acute metabolic encephalopathy Likely due to a combination of acute illness as well as the fact that he has history of alcoholic abuse.  According to his physician at the other hospital his mentation has been improving over the last 48 hours.  Patient does not have any focal neurological deficits currently.  Continue to monitor.  History of coronary artery disease He has had stent placement in the past but not in the last 2 years.  He is noted to be on aspirin and Plavix which will be continued.  Statin.  Not noted to be on any beta-blockers.  EKG will be ordered to check QT interval.  History of hypothyroidism Continue with levothyroxine.  Check TSH and free T4 due to recent issues with mentation as well as frequent falls.  History of vitamin B12 deficiency Noted to be on vitamin B12 at home.  Compliance is unknown.  We will check a B12 level.  History of alcohol abuse with the alcohol withdrawal syndrome at the outside hospital Currently does not appear to be in alcohol withdrawal.  We will use Ativan as needed.  Continue with  thiamine, folate and multivitamins.  DVT Prophylaxis: Lovenox Code Status: Full code Family Communication: Discussed with the patient's brother Disposition: Progressive care unit Consults called: None Admission Status: Inpatient  Severity of Illness: The appropriate patient status for this patient is INPATIENT. Inpatient status is judged to be reasonable and necessary in order to provide the required intensity of service to ensure the patient's safety. The patient's presenting symptoms,  physical exam findings, and initial radiographic and laboratory data in the context of their chronic comorbidities is felt to place them at high risk for further clinical deterioration. Furthermore, it is not anticipated that the patient will be medically stable for discharge from the hospital within 2 midnights of admission. The following factors support the patient status of inpatient.   " The patient's presenting symptoms include shortness of breath. " The worrisome physical exam findings include hypoxia. " The initial radiographic and laboratory data are worrisome because of bilateral infiltrates. " The chronic co-morbidities include coronary artery disease.   * I certify that at the point of admission it is my clinical judgment that the patient will require inpatient hospital care spanning beyond 2 midnights from the point of admission due to high intensity of service, high risk for further deterioration and high frequency of surveillance required.*  Further management decisions will depend on results of further testing and patient's response to treatment.   Liona Wengert [employer]  [facility] operational officer on [email or web address]  12/27/2018, 4:42 PM

## 2018-12-27 NOTE — Progress Notes (Signed)
Pharmacy Antibiotic Note  Billy Byrd is a 78 y.o. male admitted on 12/27/2018 with COVID 19.  Pharmacy has been consulted for Remdesivir dosing.  CXR (at OSH) shows worsening bilateral infiltrates Pt requiring supplemental oxygen (HFNC 10L) ALT 12 (at OSH)  Plan: Remdesivir 200mg  IV now then 100mg  IV daily x 4 days Will f/u ALT and pt's clinical condition   Height: 6\' 5"  (195.6 cm) Weight: 205 lb (93 kg) IBW/kg (Calculated) : 89.1  Temp (24hrs), Avg:97.8 F (36.6 C), Min:97.8 F (36.6 C), Max:97.8 F (36.6 C)  No results for input(s): WBC, CREATININE, LATICACIDVEN, VANCOTROUGH, VANCOPEAK, VANCORANDOM, GENTTROUGH, GENTPEAK, GENTRANDOM, TOBRATROUGH, TOBRAPEAK, TOBRARND, AMIKACINPEAK, AMIKACINTROU, AMIKACIN in the last 168 hours.  CrCl cannot be calculated (Patient's most recent lab result is older than the maximum 21 days allowed.).    No Known Allergies  Antimicrobials this admission: 8/5 Remdesivir >> 8/9  Thank you for allowing pharmacy to be a part of this patient's care.  Sherlon Handing, PharmD, BCPS CGV Clinical pharmacist phone [phone] 12/27/2018 4:45 PM

## 2018-12-28 ENCOUNTER — Inpatient Hospital Stay (HOSPITAL_COMMUNITY): Payer: Medicare Other

## 2018-12-28 LAB — BASIC METABOLIC PANEL
Anion gap: 12 (ref 5–15)
BUN: 22 mg/dL (ref 8–23)
CO2: 28 mmol/L (ref 22–32)
Calcium: 9 mg/dL (ref 8.9–10.3)
Chloride: 98 mmol/L (ref 98–111)
Creatinine, Ser: 1.03 mg/dL (ref 0.61–1.24)
GFR calc Af Amer: >60 mL/min (ref >60)
GFR calc non Af Amer: >60 mL/min (ref >60)
Glucose, Bld: 123 mg/dL — ABNORMAL HIGH (ref 70–99)
Potassium: 3.8 mmol/L (ref 3.5–5.1)
Sodium: 138 mmol/L (ref 135–145)

## 2018-12-28 LAB — CBC WITH DIFFERENTIAL/PLATELET
Abs Immature Granulocytes: 0.04 10*3/uL (ref 0.00–0.07)
Basophils Absolute: 0 10*3/uL (ref 0.0–0.1)
Basophils Relative: 0 %
Eosinophils Absolute: 0 10*3/uL (ref 0.0–0.5)
Eosinophils Relative: 0 %
HCT: 37.4 % — ABNORMAL LOW (ref 39.0–52.0)
Hemoglobin: 12.5 g/dL — ABNORMAL LOW (ref 13.0–17.0)
Immature Granulocytes: 0 %
Lymphocytes Relative: 6 %
Lymphs Abs: 0.7 10*3/uL (ref 0.7–4.0)
MCH: 33.5 pg (ref 26.0–34.0)
MCHC: 33.4 g/dL (ref 30.0–36.0)
MCV: 100.3 fL — ABNORMAL HIGH (ref 80.0–100.0)
Monocytes Absolute: 0.6 10*3/uL (ref 0.1–1.0)
Monocytes Relative: 5 %
Neutro Abs: 9.8 10*3/uL — ABNORMAL HIGH (ref 1.7–7.7)
Neutrophils Relative %: 89 %
Platelets: 217 10*3/uL (ref 150–400)
RBC: 3.73 MIL/uL — ABNORMAL LOW (ref 4.22–5.81)
RDW: 12.2 % (ref 11.5–15.5)
WBC: 11.1 10*3/uL — ABNORMAL HIGH (ref 4.0–10.5)
nRBC: 0 % (ref 0.0–0.2)

## 2018-12-28 LAB — COMPREHENSIVE METABOLIC PANEL
ALT: 28 U/L (ref 0–44)
AST: 39 U/L (ref 15–41)
Albumin: 2.9 g/dL — ABNORMAL LOW (ref 3.5–5.0)
Alkaline Phosphatase: 59 U/L (ref 38–126)
Anion gap: 11 (ref 5–15)
BUN: 18 mg/dL (ref 8–23)
CO2: 28 mmol/L (ref 22–32)
Calcium: 9.1 mg/dL (ref 8.9–10.3)
Chloride: 98 mmol/L (ref 98–111)
Creatinine, Ser: 1.07 mg/dL (ref 0.61–1.24)
GFR calc Af Amer: >60 mL/min (ref >60)
GFR calc non Af Amer: >60 mL/min (ref >60)
Glucose, Bld: 94 mg/dL (ref 70–99)
Potassium: 4.2 mmol/L (ref 3.5–5.1)
Sodium: 137 mmol/L (ref 135–145)
Total Bilirubin: 0.5 mg/dL (ref 0.3–1.2)
Total Protein: 6.2 g/dL — ABNORMAL LOW (ref 6.5–8.1)

## 2018-12-28 LAB — RETICULOCYTES
Immature Retic Fract: 4.5 % (ref 2.3–15.9)
RBC.: 3.73 MIL/uL — ABNORMAL LOW (ref 4.22–5.81)
Retic Count, Absolute: 14.9 10*3/uL — ABNORMAL LOW (ref 19.0–186.0)
Retic Ct Pct: 0.4 % (ref 0.4–3.1)

## 2018-12-28 LAB — FOLATE: Folate: 20 ng/mL (ref >5.9)

## 2018-12-28 LAB — VITAMIN B12: Vitamin B-12: 410 pg/mL (ref 180–914)

## 2018-12-28 LAB — PROCALCITONIN: Procalcitonin: <0.1 ng/mL

## 2018-12-28 LAB — IRON AND TIBC
Iron: 32 ug/dL — ABNORMAL LOW (ref 45–182)
Saturation Ratios: 15 % — ABNORMAL LOW (ref 17.9–39.5)
TIBC: 213 ug/dL — ABNORMAL LOW (ref 250–450)
UIBC: 181 ug/dL

## 2018-12-28 LAB — T4, FREE: Free T4: 0.96 ng/dL (ref 0.61–1.12)

## 2018-12-28 LAB — FERRITIN: Ferritin: 347 ng/mL — ABNORMAL HIGH (ref 24–336)

## 2018-12-28 LAB — D-DIMER, QUANTITATIVE: D-Dimer, Quant: 5.19 ug/mL-FEU — ABNORMAL HIGH (ref 0.00–0.50)

## 2018-12-28 LAB — C-REACTIVE PROTEIN: CRP: 1 mg/dL — ABNORMAL HIGH (ref <1.0)

## 2018-12-28 LAB — MAGNESIUM
Magnesium: 2 mg/dL (ref 1.7–2.4)
Magnesium: 2 mg/dL (ref 1.7–2.4)

## 2018-12-28 LAB — TSH: TSH: 0.202 u[IU]/mL — ABNORMAL LOW (ref 0.350–4.500)

## 2018-12-28 MED ORDER — ATORVASTATIN CALCIUM 10 MG PO TABS
10.0000 mg | ORAL_TABLET | Freq: Every day | ORAL | Status: DC
  Administered 2018-12-28 – 2019-01-13 (×17): 10 mg via ORAL
  Filled 2018-12-28 (×17): qty 1

## 2018-12-28 MED ORDER — METOPROLOL TARTRATE 25 MG PO TABS
25.0000 mg | ORAL_TABLET | Freq: Two times a day (BID) | ORAL | Status: DC
  Administered 2018-12-28 – 2019-01-13 (×30): 25 mg via ORAL
  Filled 2018-12-28 (×31): qty 1

## 2018-12-28 MED ORDER — FUROSEMIDE 10 MG/ML IJ SOLN
40.0000 mg | Freq: Once | INTRAMUSCULAR | Status: AC
  Administered 2018-12-28: 40 mg via INTRAVENOUS
  Filled 2018-12-28: qty 4

## 2018-12-28 MED ORDER — ENOXAPARIN SODIUM 40 MG/0.4ML ~~LOC~~ SOLN
40.0000 mg | Freq: Two times a day (BID) | SUBCUTANEOUS | Status: DC
  Administered 2018-12-28 – 2019-01-13 (×33): 40 mg via SUBCUTANEOUS
  Filled 2018-12-28 (×33): qty 0.4

## 2018-12-28 MED ORDER — POTASSIUM CHLORIDE CRYS ER 20 MEQ PO TBCR
40.0000 meq | EXTENDED_RELEASE_TABLET | Freq: Once | ORAL | Status: AC
  Administered 2018-12-28: 18:00:00 40 meq via ORAL
  Filled 2018-12-28: qty 2

## 2018-12-28 NOTE — Progress Notes (Signed)
Tele called reported 20 beat run vtack in lead 1 AVL lead was a wide QRS. BP 111/77 denies chest pain. Answers questions approprietly. Notified MD [place] Pink

## 2018-12-28 NOTE — Progress Notes (Signed)
Ok to increase lovenox to 40mg  BID due to his ddimer>5 per Dr. Curly Rim.  Onnie Boer, PharmD, BCIDP, AAHIVP, CPP Infectious Disease Pharmacist 12/28/2018 1:15 PM

## 2018-12-28 NOTE — Progress Notes (Signed)
Notified by tele that the pt experienced a 15 beat run of v-tach. Pt denied chest pain or SOB.  Bonnielee Haff, MD was paged/notified. EKG completed per Krishnan's request. EKG normal sinus rhythm. MD notified.

## 2018-12-28 NOTE — Progress Notes (Addendum)
I attempted to call the pt's Sister to provide a daily update, but the call went straight to voicemail. I will try to call again later.   The pt's Sister was called again at 1315, but the call when straight to voicemail.   Billy Byrd, the pt's brother, was called and provided with an update instead.

## 2018-12-28 NOTE — Progress Notes (Signed)
PROGRESS NOTE  Billy Byrd YKZ:[id number] DOB: [birth year] DOA: 12/27/2018  PCP: Orpah Cobb., MD  Brief History/Interval Summary: 78 y.o. male with a past medical history of coronary artery disease, status post PCI more than 2 years ago, on aspirin and Plavix, history of hypothyroidism, history of alcohol abuse, history of tobacco abuse who has been living with a sister for the past 1 month.  It is not clear why he moved in with his sister but apparently has been having frequent falls and has not been able to take care of himself, presumably due to his issues with alcoholism.  Patient apparently goes to a "club" where he consumes alcohol.  It is thought that that is where he contracted COVID-19.  He has been sick for about 5 to 6 days.  He was taken to the hospital in [facility] on Saturday and was hospitalized.  He was started on antibacterial agents.  Subsequently the COVID-19 test came back as being positive.  His oxygenation has worsened over the last 24 hours.  He was initially on 2 L of oxygen subsequently went to 6 L and then 10 L this morning.  Chest x-ray was repeated this morning which showed worsening bilateral infiltrates.  He was subsequently transferred over to this facility for further management.   Reason for Visit: Acute respiratory disease due to COVID-19  Consultants: None  Procedures: None  Antibiotics: He was on ceftriaxone and azithromycin at the outside facility.  Not continued.  Subjective/Interval History: Patient seems to be less confused this morning.  States that he still has a little shortness of breath.  Occasional cough.  Denies any chest pain.  No nausea or vomiting    Assessment/Plan:  Acute Hypoxic Resp. Failure due to Acute Covid 19 Viral Illness  COVID-19 Labs  Recent Labs    12/27/18 1645 12/28/18 0235  DDIMER  --  5.19*  FERRITIN  --  347*  CRP 1.1* 1.0*    Fever: Remains afebrile Oxygen requirements: HF [place].  8 L/min.   Saturating in the 90s. Antibacterials: Not continued in this hospital Remdesivir: Day 2 today Steroids: Solu-Medrol 60 mg every 12 hours Diuretics: He was given Lasix yesterday in the outside facility.  He is noted to be on scheduled Lasix at home. Will give additional dose today. Actemra: Not indicated Vitamin C and Zinc: Continue DVT Prophylaxis:  Lovenox 40 mg every 24 hours  Research Studies: Not enrolled into any research studies yet  Patient's respiratory status seems to be stable this morning.  Oxygen requirements have improved.  Perhaps slightly better compared to yesterday afternoon.  Lasix might have helped.  He will be given additional dose today.  Continue with Remdesivir and steroids.  CRP is only minimally elevated.  No indication for Actemra as yet.  Monitor inflammatory markers.  Prone positioning as much as possible.  Incentive spirometry and mobilization.  PT and OT evaluation.  The treatment plan and use of medications and known side effects were discussed with family (patient unable to participate due to encephalopathy).  Some of the medications used are based on case reports/anecdotal data which are not peer-reviewed and has not been studied using randomized control trials.  Complete risks and long-term side effects are unknown, however in the best clinical judgment they seem to be of some benefit.  Family agrees with the treatment plan and want to receive these treatments as indicated.  His brother who is the healthcare power of attorney is agreeable to  Actemra if needed.  Acute metabolic encephalopathy Most likely due to combination of acute illness as well as his history of alcoholism.  Seems to be improving.  He seems to be more oriented today compared to yesterday.  No focal neurological deficits.  History of coronary artery disease He has had stent placement previously.  Followed by cardiologist in [place].  Continue aspirin Plavix.  Statin could be resumed as his LFTs  are unremarkable.  Macrocytic anemia No evidence of overt bleeding.  TSH noted to be low.  Anemia panel reviewed.  No clear deficiencies identified.  Continue vitamin B12.  History of hypothyroidism Continue levothyroxine.  TSH is low most likely due to sick euthyroid.  Free T4 is pending.  History of vitamin B12 deficiency Continue vitamin B12.  History of alcohol abuse with alcohol withdrawal syndrome at the outside hospital No evidence for withdrawal at this time.  Continue CIWA protocol.  Continue thiamine multivitamins folic acid.  DVT Prophylaxis: Lovenox PUD Prophylaxis: Protonix Code Status: Full code Family Communication: Discussed with the patient's brother yesterday.  Will do so again today Disposition Plan: He apparently lives with his sister.  PT and OT evaluation.  Not ready for discharge yet.  Medications:  Scheduled: . aspirin EC  81 mg Oral Daily  . clopidogrel  75 mg Oral Daily  . enoxaparin (LOVENOX) injection  40 mg Subcutaneous Q24H  . folic acid  1 mg Oral Daily  . Ipratropium-Albuterol  1 puff Inhalation Q6H  . levothyroxine  150 mcg Oral Daily  . methylPREDNISolone (SOLU-MEDROL) injection  60 mg Intravenous Q12H  . multivitamin with minerals  1 tablet Oral Daily  . nicotine  14 mg Transdermal Daily  . pantoprazole  40 mg Oral Daily  . senna  1 tablet Oral BID  . sodium chloride flush  3 mL Intravenous Q12H  . sodium chloride flush  3 mL Intravenous Q12H  . thiamine  100 mg Oral Daily  . vitamin B-12  500 mcg Oral Daily  . vitamin C  500 mg Oral Daily  . zinc sulfate  220 mg Oral Daily   Continuous: . sodium chloride    . remdesivir 100 mg in NS 250 mL     YTK:ZSWFUX chloride, acetaminophen, guaiFENesin-dextromethorphan, LORazepam **OR** [DISCONTINUED] LORazepam, polyethylene glycol, sodium chloride flush   Objective:  Vital Signs  Vitals:   12/28/18 0000 12/28/18 0043 12/28/18 0400 12/28/18 0746  BP: 128/77   127/76  Pulse:   71 76  Resp:    13 16  Temp:  98.1 F (36.7 C) 97.7 F (36.5 C) 98 F (36.7 C)  TempSrc:  Oral Oral Oral  SpO2:   96% (!) 89%  Weight:      Height:        Intake/Output Summary (Last 24 hours) at 12/28/2018 1024 Last data filed at 12/28/2018 0600 Gross per 24 hour  Intake 610 ml  Output 200 ml  Net 410 ml   Filed Weights   12/27/18 1600  Weight: 93 kg    General appearance: Awake alert.  In no distress.  Not as distracted as yesterday Resp: Mildly tachypneic.  Coarse breath sounds bilaterally with few crackles at the bases.  No wheezing or rhonchi.   Cardio: S1-S2 is normal regular.  No S3-S4.  No rubs murmurs or bruit GI: Abdomen is soft.  Nontender nondistended.  Bowel sounds are present normal.  No masses organomegaly Extremities: No edema.  Full range of motion of lower extremities. Neurologic: He is  alert.  He knew he was in [place].  He knew the month.  Initially got the year wrong but then got it correctly.  No facial asymmetry.  Motor strength equal bilateral upper and lower extremities   Lab Results:  Data Reviewed: I have personally reviewed following labs and imaging studies  CBC: Recent Labs  Lab 12/28/18 0235  WBC 11.1*  NEUTROABS 9.8*  HGB 12.5*  HCT 37.4*  MCV 100.3*  PLT 115    Basic Metabolic Panel: Recent Labs  Lab 12/28/18 0235  NA 137  K 4.2  CL 98  CO2 28  GLUCOSE 94  BUN 18  CREATININE 1.07  CALCIUM 9.1  MG 2.0    GFR: Estimated Creatinine Clearance: 75.2 mL/min (by C-G formula based on SCr of 1.07 mg/dL).  Liver Function Tests: Recent Labs  Lab 12/28/18 0235  AST 39  ALT 28  ALKPHOS 59  BILITOT 0.5  PROT 6.2*  ALBUMIN 2.9*    Thyroid Function Tests: Recent Labs    12/28/18 0235  TSH 0.202*    Anemia Panel: Recent Labs    12/28/18 0235  VITAMINB12 410  FOLATE 20.0  FERRITIN 347*  TIBC 213*  IRON 32*  RETICCTPCT 0.4      Radiology Studies: Dg Chest Port 1 View  Result Date: 12/28/2018 CLINICAL DATA:  COVID-19  positivity. EXAM: PORTABLE CHEST 1 VIEW COMPARISON:  Yesterday FINDINGS: Stable low volume chest with bilateral infiltrate. No effusion or pneumothorax. Stable heart size and mediastinal contours. IMPRESSION: Low volume chest with bilateral pneumonia that is stable from yesterday. Electronically Signed   By: Monte Fantasia M.D.   On: 12/28/2018 08:57       LOS: 1 day   [facility] Pager on [email or web address]  12/28/2018, 10:24 AM

## 2018-12-28 NOTE — Evaluation (Signed)
Physical Therapy Evaluation Patient Details Name: Billy Byrd MRN: [id number] DOB: 01-21-1941 Today's Date: 12/28/2018   History of Present Illness  Pt is a 78 y.o. male admitted 12/27/18 with SOB and worsening oxygenation; COVID-19 positive. CXR with worsening bilateral infiltrates. Pt also with acute metabolic encephalopathy. PMH includes CAD, ETOH/tobacco abuse, OSA.    Clinical Impression  Pt presents with an overall decrease in functional mobility secondary to above. PTA, pt reports independent and living with sister "temporarily," but pt difficult historian and not forthcoming with details. Today, pt able to transfer and ambulate in room with min guard; limited by fatigue. Educ re: importance of mobility/up to chair. Pt would benefit from continued acute PT services to maximize functional mobility and independence prior to d/c with HHPT.   SpO2 down to 85% on 8L O2 HFNC while ambulating; recovering to 94% with 2-3 min seated rest HR 95 Post-ambulation BP 116/70    Follow Up Recommendations Home health PT;Supervision - Intermittent(may not need by d/c)    Equipment Recommendations  None recommended by PT    Recommendations for Other Services       Precautions / Restrictions Precautions Precautions: Fall Precaution Comments: Tremulous Restrictions Weight Bearing Restrictions: No      Mobility  Bed Mobility               General bed mobility comments: Received sitting at sink with OT  Transfers Overall transfer level: Needs assistance Equipment used: None Transfers: Sit to/from Stand Sit to Stand: Min guard         General transfer comment: Able to stand from [facility] and recliner with min guard for balance, heavy reliance on UE support  Ambulation/Gait Ambulation/Gait assistance: Min guard Gait Distance (Feet): 40 Feet Assistive device: None Gait Pattern/deviations: Step-through pattern;Decreased stride length Gait velocity: Decreased   General Gait Details:  Slow, tremulous gait with close min guard for balance; pt reports "I feel fine" stating he felt stable and declined use of RW. SpO2 down to 85% on 8L HFNC O2, recover to 94% with 2-3 min seated rest  Stairs            Wheelchair Mobility    Modified Rankin (Stroke Patients Only)       Balance Overall balance assessment: Needs assistance   Sitting balance-Leahy Scale: Fair       Standing balance-Leahy Scale: Fair                               Pertinent Vitals/Pain Pain Assessment: No/denies pain    Home Living Family/patient expects to be discharged to:: Private residence Living Arrangements: Other relatives(sister) Available Help at Discharge: Family;Available PRN/intermittently Type of Home: House       Home Layout: Two level Home Equipment: [employer] - 2 wheels;Shower seat Additional Comments: Pt difficult historian; unclear home set-up information. Reports he lives with sister "for now"    Prior Function Level of Independence: Independent with assistive device(s)         Comments: Intermittent use of RW "very seldom." Driving. 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, newspaper        Extremity/Trunk Assessment   Upper Extremity Assessment Upper Extremity Assessment: Generalized weakness    Lower Extremity Assessment Lower Extremity Assessment: Generalized weakness    Cervical / Trunk Assessment Cervical / Trunk Assessment: Kyphotic  Communication  Communication: HOH  Cognition Arousal/Alertness: Awake/alert Behavior During Therapy: WFL for tasks assessed/performed Overall Cognitive Status: No family/caregiver present to determine baseline cognitive functioning Area of Impairment: Orientation;Attention;Memory;Following commands;Safety/judgement;Awareness;Problem solving                 Orientation Level: Disoriented to;Situation Current Attention Level:  Sustained;Selective Memory: Decreased short-term memory Following Commands: Follows one step commands with increased time;Follows multi-step commands inconsistently Safety/Judgement: Decreased awareness of deficits;Decreased awareness of safety Awareness: Intellectual Problem Solving: Requires verbal cues;Slow processing General Comments: Required repetition of questions and instructions. Likely some baseline cognitive impairment, including slowed processing, exacerbated by very HOH      General Comments General comments (skin integrity, edema, etc.): HR 95, post-ambulation BP 116/70    Exercises     Assessment/Plan    PT Assessment Patient needs continued PT services  PT Problem List Decreased strength;Decreased activity tolerance;Decreased balance;Decreased mobility;Cardiopulmonary status limiting activity;Decreased cognition       PT Treatment Interventions DME instruction;Gait training;Stair training;Functional mobility training;Therapeutic exercise;Therapeutic activities;Balance training;Patient/family education    PT Goals (Current goals can be found in the Care Plan section)  Acute Rehab PT Goals Patient Stated Goal: Get my house built how I want it PT Goal Formulation: With patient Time For Goal Achievement: 01/11/19 Potential to Achieve Goals: Good    Frequency Min 3X/week   Barriers to discharge        Co-evaluation               AM-PAC PT "6 Clicks" Mobility  Outcome Measure Help needed turning from your back to your side while in a flat bed without using bedrails?: None Help needed moving from lying on your back to sitting on the side of a flat bed without using bedrails?: None Help needed moving to and from a bed to a chair (including a wheelchair)?: A Little Help needed standing up from a chair using your arms (e.g., wheelchair or bedside chair)?: A Little Help needed to walk in hospital room?: A Little Help needed climbing 3-5 steps with a railing? :  A Little 6 Click Score: 20    End of Session Equipment Utilized During Treatment: Gait belt;Oxygen Activity Tolerance: Patient tolerated treatment well;Patient limited by fatigue Patient left: in chair;with call bell/phone within reach;with chair alarm set Nurse Communication: Mobility status PT Visit Diagnosis: Other abnormalities of gait and mobility (R26.89);Muscle weakness (generalized) (M62.81)    Time: 8889-1694 PT Time Calculation (min) (ACUTE ONLY): 18 min   Charges:   PT Evaluation $PT Eval Moderate Complexity: [place], PT, DPT Acute Rehabilitation Services  Pager [phone] Office [place] 12/28/2018, 12:58 PM

## 2018-12-28 NOTE — Evaluation (Addendum)
Occupational Therapy Evaluation Patient Details Name: Billy Byrd MRN: [id number] DOB: July 27, 1940 Today's Date: 12/28/2018    History of Present Illness Pt is a 78 y.o. male admitted 12/27/18 with SOB and worsening oxygenation; COVID-19 positive. CXR with worsening bilateral infiltrates. Pt also with acute metabolic encephalopathy. PMH includes CAD, ETOH/tobacco abuse, OSA.   Clinical Impression   This 78 y/o male presents with the above. PTA pt reports he is independent with ADL and functional mobility. Pt presenting with generalized weakness, impaired cognition and balance deficits. He currently requires minA for short distance mobility in room without AD; min-modA for LB ADL and minA for seated UB ADL. Pt requires multimodal cues and increased time for processing/following commands (also very HOH); noted tremulous UEs with some aspects of ADL tasks. Pt on 8L HFNC with O2 sats >90%. He will benefit from continued acute OT services and recommend [place] services after discharge to maximize his safety and indpendence with ADL and mobility. Will follow.     Follow Up Recommendations  Home health OT;Supervision/Assistance - 24 hour    Equipment Recommendations  3 in 1 bedside commode(to be further assessed)           Precautions / Restrictions Precautions Precautions: Fall Precaution Comments: Tremulous Restrictions Weight Bearing Restrictions: No      Mobility Bed Mobility Overal bed mobility: Needs Assistance Bed Mobility: Supine to Sit     Supine to sit: Min guard     General bed mobility comments: for lines, safety  Transfers Overall transfer level: Needs assistance Equipment used: None Transfers: Sit to/from Stand Sit to Stand: Min assist         General transfer comment: boosting assist from EOB and to steady upon initial standing    Balance Overall balance assessment: Needs assistance Sitting-balance support: Feet supported Sitting balance-Leahy Scale: Fair     Standing balance support: No upper extremity supported;During functional activity Standing balance-Leahy Scale: Fair Standing balance comment: able to static stand with close minguard for safety                           ADL either performed or assessed with clinical judgement   ADL Overall ADL's : Needs assistance/impaired Eating/Feeding: Set up;Sitting   Grooming: Wash/dry face;Oral care;Min guard;Minimal assistance;Sitting Grooming Details (indicate cue type and reason): seated on BSC in front of sink in room Upper Body Bathing: Min guard;Sitting   Lower Body Bathing: Minimal assistance;Sit to/from stand   Upper Body Dressing : Min guard;Sitting   Lower Body Dressing: Minimal assistance;Sit to/from stand   Toilet Transfer: Minimal assistance;Ambulation Toilet Transfer Details (indicate cue type and reason): simulated via transfer to [facility] in room; short distance ambulation Toileting- Clothing Manipulation and Hygiene: Minimal assistance;Sit to/from stand       Functional mobility during ADLs: Minimal assistance(may benefit from RW) General ADL Comments: pt with cognitive impairments, weakness, impaired balance     Vision Baseline Vision/History: Wears glasses       Perception     Praxis      Pertinent Vitals/Pain Pain Assessment: No/denies pain     Hand Dominance     Extremity/Trunk Assessment Upper Extremity Assessment Upper Extremity Assessment: Generalized weakness(tremulous bil UEs)   Lower Extremity Assessment Lower Extremity Assessment: Defer to PT evaluation   Cervical / Trunk Assessment Cervical / Trunk Assessment: Kyphotic   Communication Communication Communication: HOH   Cognition Arousal/Alertness: Awake/alert Behavior During Therapy: WFL for tasks assessed/performed Overall Cognitive Status: No  family/caregiver present to determine baseline cognitive functioning Area of Impairment: Orientation;Attention;Memory;Following  commands;Safety/judgement;Awareness;Problem solving                 Orientation Level: Disoriented to;Situation Current Attention Level: Sustained;Selective Memory: Decreased short-term memory Following Commands: Follows one step commands with increased time;Follows multi-step commands inconsistently Safety/Judgement: Decreased awareness of deficits;Decreased awareness of safety Awareness: Intellectual Problem Solving: Requires verbal cues;Slow processing General Comments: Required repetition of questions and instructions. Likely some baseline cognitive impairment, including slowed processing, exacerbated by very HOH   General Comments  HR 95, post-ambulation BP 116/70    Exercises     Shoulder Instructions      Home Living Family/patient expects to be discharged to:: Private residence Living Arrangements: Other relatives(sister) Available Help at Discharge: Family;Available PRN/intermittently Type of Home: House       Home Layout: Two level     Bathroom Shower/Tub: Tub/shower unit         Home Equipment: Environmental consultant - 2 wheels;Shower seat   Additional Comments: Pt difficult historian; unclear home set-up information. Reports he lives with sister "for now"      Prior Functioning/Environment Level of Independence: Independent with assistive device(s)        Comments: Intermittent use of RW "very seldom." Driving. 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        OT Problem List: Decreased strength;Decreased range of motion;Decreased activity tolerance;Impaired balance (sitting and/or standing);Decreased cognition;Decreased safety awareness;Decreased knowledge of use of DME or AE;Cardiopulmonary status limiting activity      OT Treatment/Interventions: Self-care/ADL training;Therapeutic exercise;DME and/or AE instruction;Therapeutic activities;Patient/family education;Balance training;Cognitive  remediation/compensation;Energy conservation    OT Goals(Current goals can be found in the care plan section) Acute Rehab OT Goals Patient Stated Goal: Get my house built how I want it OT Goal Formulation: With patient Time For Goal Achievement: 01/11/19 Potential to Achieve Goals: Good  OT Frequency: Min 3X/week   Barriers to D/C:            Co-evaluation              AM-PAC OT "6 Clicks" Daily Activity     Outcome Measure Help from another person eating meals?: A Little Help from another person taking care of personal grooming?: A Little Help from another person toileting, which includes using toliet, bedpan, or urinal?: A Little Help from another person bathing (including washing, rinsing, drying)?: A Little Help from another person to put on and taking off regular upper body clothing?: A Little Help from another person to put on and taking off regular lower body clothing?: A Lot 6 Click Score: 17   End of Session Equipment Utilized During Treatment: Gait belt Nurse Communication: Mobility status  Activity Tolerance: Patient tolerated treatment well Patient left: Other (comment)(handoff to PT for session)  OT Visit Diagnosis: Unsteadiness on feet (R26.81);Other symptoms and signs involving cognitive function;Muscle weakness (generalized) (M62.81)                Time: 5188-4166 OT Time Calculation (min): 27 min Charges:  OT General Charges $OT Visit: 1 Visit OT Evaluation $OT Eval Moderate Complexity: 1 Mod OT Treatments $Self Care/Home Management : 8-22 mins  Lou Cal, OT Supplemental Rehabilitation Services Pager [phone] Office [phone]   Raymondo Band 12/28/2018, 1:15 PM

## 2018-12-29 DIAGNOSIS — I472 Ventricular tachycardia: Secondary | ICD-10-CM

## 2018-12-29 LAB — CBC WITH DIFFERENTIAL/PLATELET
Abs Immature Granulocytes: 0.05 10*3/uL (ref 0.00–0.07)
Basophils Absolute: 0 10*3/uL (ref 0.0–0.1)
Basophils Relative: 0 %
Eosinophils Absolute: 0 10*3/uL (ref 0.0–0.5)
Eosinophils Relative: 0 %
HCT: 37.8 % — ABNORMAL LOW (ref 39.0–52.0)
Hemoglobin: 12.8 g/dL — ABNORMAL LOW (ref 13.0–17.0)
Immature Granulocytes: 1 %
Lymphocytes Relative: 7 %
Lymphs Abs: 0.8 10*3/uL (ref 0.7–4.0)
MCH: 33.4 pg (ref 26.0–34.0)
MCHC: 33.9 g/dL (ref 30.0–36.0)
MCV: 98.7 fL (ref 80.0–100.0)
Monocytes Absolute: 0.6 10*3/uL (ref 0.1–1.0)
Monocytes Relative: 6 %
Neutro Abs: 9.7 10*3/uL — ABNORMAL HIGH (ref 1.7–7.7)
Neutrophils Relative %: 86 %
Platelets: 238 10*3/uL (ref 150–400)
RBC: 3.83 MIL/uL — ABNORMAL LOW (ref 4.22–5.81)
RDW: 12.1 % (ref 11.5–15.5)
WBC: 11.1 10*3/uL — ABNORMAL HIGH (ref 4.0–10.5)
nRBC: 0 % (ref 0.0–0.2)

## 2018-12-29 LAB — D-DIMER, QUANTITATIVE: D-Dimer, Quant: 3.91 ug/mL-FEU — ABNORMAL HIGH (ref 0.00–0.50)

## 2018-12-29 LAB — COMPREHENSIVE METABOLIC PANEL
ALT: 29 U/L (ref 0–44)
AST: 29 U/L (ref 15–41)
Albumin: 2.9 g/dL — ABNORMAL LOW (ref 3.5–5.0)
Alkaline Phosphatase: 56 U/L (ref 38–126)
Anion gap: 9 (ref 5–15)
BUN: 21 mg/dL (ref 8–23)
CO2: 28 mmol/L (ref 22–32)
Calcium: 8.9 mg/dL (ref 8.9–10.3)
Chloride: 102 mmol/L (ref 98–111)
Creatinine, Ser: 0.96 mg/dL (ref 0.61–1.24)
GFR calc Af Amer: >60 mL/min (ref >60)
GFR calc non Af Amer: >60 mL/min (ref >60)
Glucose, Bld: 120 mg/dL — ABNORMAL HIGH (ref 70–99)
Potassium: 4.1 mmol/L (ref 3.5–5.1)
Sodium: 139 mmol/L (ref 135–145)
Total Bilirubin: 0.5 mg/dL (ref 0.3–1.2)
Total Protein: 6.1 g/dL — ABNORMAL LOW (ref 6.5–8.1)

## 2018-12-29 LAB — PROCALCITONIN: Procalcitonin: <0.1 ng/mL

## 2018-12-29 LAB — C-REACTIVE PROTEIN: CRP: 0.9 mg/dL (ref <1.0)

## 2018-12-29 LAB — FERRITIN: Ferritin: 361 ng/mL — ABNORMAL HIGH (ref 24–336)

## 2018-12-29 LAB — MAGNESIUM: Magnesium: 2 mg/dL (ref 1.7–2.4)

## 2018-12-29 NOTE — Progress Notes (Signed)
Tele called to report 11 beat run of VT. Promptly responded to pt bedside. Pt sitting in bed talking on phone. Pt is asymptomatic, no complaints of pain or shortness of breath.

## 2018-12-29 NOTE — Progress Notes (Signed)
PROGRESS NOTE  Billy Byrd BWG:[id number] DOB: 11-14-1940 DOA: 12/27/2018  PCP: Orpah Cobb., MD  Brief History/Interval Summary: 78 y.o. male with a past medical history of coronary artery disease, status post PCI more than 2 years ago, on aspirin and Plavix, history of hypothyroidism, history of alcohol abuse, history of tobacco abuse who has been living with a sister for the past 1 month.  It is not clear why he moved in with his sister but apparently has been having frequent falls and has not been able to take care of himself, presumably due to his issues with alcoholism.  Patient apparently goes to a "club" where he consumes alcohol.  It is thought that that is where he contracted COVID-19.  He has been sick for about 5 to 6 days.  He was taken to the hospital in [facility] on Saturday and was hospitalized.  He was started on antibacterial agents.  Subsequently the COVID-19 test came back as being positive.  His oxygenation has worsened over the last 24 hours.  He was initially on 2 L of oxygen subsequently went to 6 L and then 10 L this morning.  Chest x-ray was repeated this morning which showed worsening bilateral infiltrates.  He was subsequently transferred over to this facility for further management.   Reason for Visit: Acute respiratory disease due to COVID-19  Consultants: None  Procedures: None  Antibiotics: He was on ceftriaxone and azithromycin at the outside facility.  Not continued.  Subjective/Interval History: Patient seems to be less confused but still distracted.  Denies any complaints this morning.  Specifically no chest pain.  Shortness of breath is improved.  Occasional cough.  Telemetry shows some artifact this morning.  Last episode of nonsustained VT was around 8:40 PM last night.     Assessment/Plan:  Acute Hypoxic Resp. Failure due to Acute Covid 19 Viral Illness  COVID-19 Labs  Recent Labs    12/27/18 1645 12/28/18 0235 12/29/18 0305   DDIMER  --  5.19* 3.91*  FERRITIN  --  347* 361*  CRP 1.1* 1.0* 0.9    Fever: Remains afebrile Oxygen requirements: On HF [place].  3 L/min.  Saturating in the early 90s.   Antibacterials: Not continued in this hospital Remdesivir: Day 3 today Steroids: Solu-Medrol 60 mg every 12 hours Diuretics: He does take Lasix at home on a regular basis.  Given an intravenous dose yesterday.   Actemra: Not indicated Vitamin C and Zinc: Continue DVT Prophylaxis:  Lovenox 40 mg every 24 hours  Research Studies: Not enrolled into any research studies yet  Patient's respiratory status continues to improve.  Oxygen requirements have improved.  Inflammatory markers are better.  Patient remains on Remdesivir and steroids.  He did not require Actemra.  Prone positioning as much as possible.  Incentive spirometry and mobilization.  PT and OT evaluation.  Continue to hold off on antibacterials.  Procalcitonin level remains less than 0.1.  The treatment plan and use of medications and known side effects were discussed with family (patient unable to participate due to encephalopathy).  Some of the medications used are based on case reports/anecdotal data which are not peer-reviewed and has not been studied using randomized control trials.  Complete risks and long-term side effects are unknown, however in the best clinical judgment they seem to be of some benefit.  Family agrees with the treatment plan and want to receive these treatments as indicated.  His brother who is the healthcare power of  attorney is agreeable to Actemra if needed.  Acute metabolic encephalopathy Most likely due to combination of acute illness as well as his history of alcoholism.  Mental status has improved.  Probably not close to baseline yet.  Takes a long time to answer questions.  No focal deficits noted.   NSVT Noted to have multiple episodes of nonsustained ventricular tachycardia yesterday.  Patient was asymptomatic.  EKG did not show any  concerning findings.  Electrolytes within reasonable limits.  His potassium was supplemented.  He was started on beta-blocker yesterday.  Appears to have helped.  No echocardiogram in our system.  Apparently he is followed by a cardiologist in [place].  According to care everywhere he had a stress test in 2017 which showed normal systolic function at that time.  Patient will need outpatient follow-up with cardiology.  History of coronary artery disease He has had stent placement previously.  Followed by cardiologist in [place].  Continue aspirin, Plavix and statin.  Beta-blockers initiated as discussed above.    Macrocytic anemia No evidence of overt bleeding. Anemia panel reviewed.  No clear deficiencies identified.  Continue vitamin B12.  History of hypothyroidism Continue levothyroxine.  TSH is low most likely due to sick euthyroid.  Free T4 is normal at 0.96.  History of vitamin B12 deficiency Continue vitamin B12.  History of alcohol abuse with alcohol withdrawal syndrome at the outside hospital No evidence for withdrawal at this time.  Continue thiamine multivitamins folic acid.  Has been on CIWA protocol.  DVT Prophylaxis: Lovenox PUD Prophylaxis: Protonix Code Status: Full code Family Communication: Cussed with patient.  His brother is being updated on a daily basis. Disposition Plan: Apparently has been living with a sister for the last 1 month.  PT and OT evaluation.  Medications:  Scheduled: . aspirin EC  81 mg Oral Daily  . atorvastatin  10 mg Oral Daily  . clopidogrel  75 mg Oral Daily  . enoxaparin (LOVENOX) injection  40 mg Subcutaneous BID  . folic acid  1 mg Oral Daily  . Ipratropium-Albuterol  1 puff Inhalation Q6H  . levothyroxine  150 mcg Oral Daily  . methylPREDNISolone (SOLU-MEDROL) injection  60 mg Intravenous Q12H  . metoprolol tartrate  25 mg Oral BID  . multivitamin with minerals  1 tablet Oral Daily  . nicotine  14 mg Transdermal Daily  . pantoprazole   40 mg Oral Daily  . senna  1 tablet Oral BID  . sodium chloride flush  3 mL Intravenous Q12H  . sodium chloride flush  3 mL Intravenous Q12H  . thiamine  100 mg Oral Daily  . vitamin B-12  500 mcg Oral Daily  . vitamin C  500 mg Oral Daily  . zinc sulfate  220 mg Oral Daily   Continuous: . sodium chloride Stopped (12/28/18 1858)  . remdesivir 100 mg in NS 250 mL Stopped (12/28/18 1836)   IDP:OEUMPN chloride, acetaminophen, guaiFENesin-dextromethorphan, LORazepam **OR** [DISCONTINUED] LORazepam, polyethylene glycol, sodium chloride flush   Objective:  Vital Signs  Vitals:   12/29/18 0300 12/29/18 0400 12/29/18 0739 12/29/18 0741  BP:  128/63  131/77  Pulse: 61 69  70  Resp: 11 13  10   Temp:  98 F (36.7 C)  (!) 97.5 F (36.4 C)  TempSrc:  Oral  Oral  SpO2: 96% 96% 97% 97%  Weight:      Height:        Intake/Output Summary (Last 24 hours) at 12/29/2018 3614 Last data filed at  12/28/2018 1858 Gross per 24 hour  Intake 604.31 ml  Output 600 ml  Net 4.31 ml   Filed Weights   12/27/18 1600  Weight: 93 kg    General appearance: Awake alert.  In no distress.  Distracted. Resp: Not as tachypneic as yesterday.  Coarse breath sounds bilaterally.  Few crackles at the bases.  No wheezing or rhonchi.   Cardio: S1-S2 is normal regular.  No S3-S4.  No rubs murmurs or bruit GI: Abdomen is soft.  Nontender nondistended.  Bowel sounds are present normal.  No masses organomegaly Extremities: No edema.  Full range of motion of lower extremities. Neurologic: Oriented to place in the sense that he knew he was in the hospital.  He knew he was in [place].  Did not get the year correct. No focal neurological deficits.     Lab Results:  Data Reviewed: I have personally reviewed following labs and imaging studies  CBC: Recent Labs  Lab 12/28/18 0235 12/29/18 0305  WBC 11.1* 11.1*  NEUTROABS 9.8* 9.7*  HGB 12.5* 12.8*  HCT 37.4* 37.8*  MCV 100.3* 98.7  PLT 217 238    Basic  Metabolic Panel: Recent Labs  Lab 12/28/18 0235 12/28/18 1648 12/29/18 0305  NA 137 138 139  K 4.2 3.8 4.1  CL 98 98 102  CO2 28 28 28   GLUCOSE 94 123* 120*  BUN 18 22 21   CREATININE 1.07 1.03 0.96  CALCIUM 9.1 9.0 8.9  MG 2.0 2.0 2.0    GFR: Estimated Creatinine Clearance: 83.8 mL/min (by C-G formula based on SCr of 0.96 mg/dL).  Liver Function Tests: Recent Labs  Lab 12/28/18 0235 12/29/18 0305  AST 39 29  ALT 28 29  ALKPHOS 59 56  BILITOT 0.5 0.5  PROT 6.2* 6.1*  ALBUMIN 2.9* 2.9*    Thyroid Function Tests: Recent Labs    12/28/18 0235  TSH 0.202*  FREET4 0.96    Anemia Panel: Recent Labs    12/28/18 0235 12/29/18 0305  VITAMINB12 410  --   FOLATE 20.0  --   FERRITIN 347* 361*  TIBC 213*  --   IRON 32*  --   RETICCTPCT 0.4  --       Radiology Studies: Dg Chest Port 1 View  Result Date: 12/28/2018 CLINICAL DATA:  COVID-19 positivity. EXAM: PORTABLE CHEST 1 VIEW COMPARISON:  Yesterday FINDINGS: Stable low volume chest with bilateral infiltrate. No effusion or pneumothorax. Stable heart size and mediastinal contours. IMPRESSION: Low volume chest with bilateral pneumonia that is stable from yesterday. Electronically Signed   By: Monte Fantasia M.D.   On: 12/28/2018 08:57       LOS: 2 days   [place] [facility] Pager on [email or web address]  12/29/2018, 7:42 AM

## 2018-12-30 LAB — CBC WITH DIFFERENTIAL/PLATELET
Abs Immature Granulocytes: 0.06 10*3/uL (ref 0.00–0.07)
Basophils Absolute: 0 10*3/uL (ref 0.0–0.1)
Basophils Relative: 0 %
Eosinophils Absolute: 0 10*3/uL (ref 0.0–0.5)
Eosinophils Relative: 0 %
HCT: 37.8 % — ABNORMAL LOW (ref 39.0–52.0)
Hemoglobin: 12.4 g/dL — ABNORMAL LOW (ref 13.0–17.0)
Immature Granulocytes: 1 %
Lymphocytes Relative: 6 %
Lymphs Abs: 0.7 10*3/uL (ref 0.7–4.0)
MCH: 32.9 pg (ref 26.0–34.0)
MCHC: 32.8 g/dL (ref 30.0–36.0)
MCV: 100.3 fL — ABNORMAL HIGH (ref 80.0–100.0)
Monocytes Absolute: 0.7 10*3/uL (ref 0.1–1.0)
Monocytes Relative: 6 %
Neutro Abs: 10.4 10*3/uL — ABNORMAL HIGH (ref 1.7–7.7)
Neutrophils Relative %: 87 %
Platelets: 301 10*3/uL (ref 150–400)
RBC: 3.77 MIL/uL — ABNORMAL LOW (ref 4.22–5.81)
RDW: 12.2 % (ref 11.5–15.5)
WBC: 11.8 10*3/uL — ABNORMAL HIGH (ref 4.0–10.5)
nRBC: 0 % (ref 0.0–0.2)

## 2018-12-30 LAB — COMPREHENSIVE METABOLIC PANEL
ALT: 27 U/L (ref 0–44)
AST: 23 U/L (ref 15–41)
Albumin: 2.7 g/dL — ABNORMAL LOW (ref 3.5–5.0)
Alkaline Phosphatase: 52 U/L (ref 38–126)
Anion gap: 9 (ref 5–15)
BUN: 24 mg/dL — ABNORMAL HIGH (ref 8–23)
CO2: 27 mmol/L (ref 22–32)
Calcium: 8.8 mg/dL — ABNORMAL LOW (ref 8.9–10.3)
Chloride: 100 mmol/L (ref 98–111)
Creatinine, Ser: 0.97 mg/dL (ref 0.61–1.24)
GFR calc Af Amer: >60 mL/min (ref >60)
GFR calc non Af Amer: >60 mL/min (ref >60)
Glucose, Bld: 122 mg/dL — ABNORMAL HIGH (ref 70–99)
Potassium: 4 mmol/L (ref 3.5–5.1)
Sodium: 136 mmol/L (ref 135–145)
Total Bilirubin: 0.8 mg/dL (ref 0.3–1.2)
Total Protein: 5.9 g/dL — ABNORMAL LOW (ref 6.5–8.1)

## 2018-12-30 LAB — D-DIMER, QUANTITATIVE: D-Dimer, Quant: 3.79 ug/mL-FEU — ABNORMAL HIGH (ref 0.00–0.50)

## 2018-12-30 LAB — MAGNESIUM: Magnesium: 2.1 mg/dL (ref 1.7–2.4)

## 2018-12-30 LAB — FERRITIN: Ferritin: 349 ng/mL — ABNORMAL HIGH (ref 24–336)

## 2018-12-30 LAB — C-REACTIVE PROTEIN: CRP: <0.8 mg/dL (ref <1.0)

## 2018-12-30 MED ORDER — METHYLPREDNISOLONE SODIUM SUCC 125 MG IJ SOLR
60.0000 mg | INTRAMUSCULAR | Status: DC
  Administered 2018-12-31: 60 mg via INTRAVENOUS
  Filled 2018-12-30: qty 2

## 2018-12-30 NOTE — TOC Initial Note (Signed)
Transition of Care [facility]) - Initial/Assessment Note    Patient Details  Name: Billy Byrd MRN: [id number] Date of Birth: 07-16-1940  Transition of Care [facility]) CM/SW Contact:    Ninfa Meeker, RN Phone Number: [phone] (working remotely) 12/30/2018, 4:05 PM  Clinical Narrative:    Pt is a 78 y.o. male admitted 12/27/18 with SOB and worsening oxygenation; COVID-19 positive. Beginning to improve. Case manager contacted patient via telephone to discuss need for [facility] services, he is experiencing some slight confusion and cannot give appropriate contact information. CM contacted patient's brother Billy Byrd, who says patient lives with their sister Billy Byrd and asked that I contact her at [phone]. Patient lives in [place], [place] and [place] had received permission from [place] to call for referral for [place]. Billy Byrd said she has some concerns that patient has been very forgetful and wonders if he will be safe at home. Per PT eval patient is appropriate for Home with Home Health services. Case Manager faxed orders to [facility] [phone]. Per Mel Almond, they are running a week behind. CM will continue to monitor for oxygen needs at discharge.       Expected Discharge Plan: [place] Barriers to Discharge: Continued Medical Work up   Patient Goals and CMS Choice   CMS Medicare.gov Compare Post Acute Care list provided to:: Patient Represenative (must comment)(Brother Billy Byrd) Choice offered to / list presented to : Sibling(Brother: Billy Byrd)  Expected Discharge Plan and Services Expected Discharge Plan: [place]   Discharge Planning Services: CM Consult Post Acute Care Choice: Home Health                             HH Arranged: PT, OT [facility] Agency: [facility] [place], [place]) Date [facility] Agency Contacted: 12/30/18 Time [place]: [id number] Representative spoke with at [place]: Billy Byrd  Arrangements/Services   Lives with:: Relatives(Lives with his sister Billy Byrd) Patient language and need for interpreter reviewed:: No Do you feel safe going back to the place where you live?: Yes      Need for Family Participation in Patient Care: Yes (Comment) Care giver support system in place?: Yes (comment)   Criminal Activity/Legal Involvement Pertinent to Current Situation/Hospitalization: No - Comment as needed  Activities of Daily Living   ADL Screening (condition at time of admission) Patient's cognitive ability adequate to safely complete daily activities?: No Is the patient deaf or have difficulty hearing?: Yes Does the patient have difficulty seeing, even when wearing glasses/contacts?: No Does the patient have difficulty concentrating, remembering, or making decisions?: Yes Patient able to express need for assistance with ADLs?: Yes Does the patient have difficulty dressing or bathing?: Yes Independently performs ADLs?: Yes (appropriate for developmental age) Does the patient have difficulty walking or climbing stairs?: Yes Weakness of Legs: Both Weakness of Arms/Hands: Both  Permission Sought/Granted Permission sought to share information with : Case Manager                Emotional Assessment       Orientation: : Oriented to Self, Oriented to Place, Oriented to  Time Alcohol / Substance Use: Not Applicable    Admission diagnosis:  covid 19 pneumonia Patient Active Problem List   Diagnosis Date Noted  . Acute respiratory disease due to COVID-19 virus 12/27/2018  . Acute respiratory failure with hypoxia ([place]) 12/27/2018  . Acute metabolic encephalopathy 19/37/9024  .  Alcohol abuse 12/27/2018  . CAD (coronary artery disease) 12/27/2018  . Hypothyroidism 12/27/2018  . Osteoarthritis of right hip 01/14/2014   PCP:  Babs Bertin, Odette Fraction., MD Pharmacy:   [facility] [place], [place]. [address] [place]  [address] Phone: [phone] Fax: [phone]     Social Determinants of Health (SDOH) Interventions    Readmission Risk Interventions No flowsheet data found.

## 2018-12-30 NOTE — Progress Notes (Signed)
PROGRESS NOTE  Billy Byrd JYN:[id number] DOB: Mar 14, 1941 DOA: 12/27/2018  PCP: Orpah Cobb., MD  Brief History/Interval Summary: 78 y.o. male with a past medical history of coronary artery disease, status post PCI more than 2 years ago, on aspirin and Plavix, history of hypothyroidism, history of alcohol abuse, history of tobacco abuse who has been living with a sister for the past 1 month.  It is not clear why he moved in with his sister but apparently has been having frequent falls and has not been able to take care of himself, presumably due to his issues with alcoholism.  Patient apparently goes to a "club" where he consumes alcohol.  It is thought that that is where he contracted COVID-19.  He has been sick for about 5 to 6 days.  He was taken to the hospital in [facility] on Saturday and was hospitalized.  He was started on antibacterial agents.  Subsequently the COVID-19 test came back as being positive.  His oxygenation has worsened over the last 24 hours.  He was initially on 2 L of oxygen subsequently went to 6 L and then 10 L this morning.  Chest x-ray was repeated this morning which showed worsening bilateral infiltrates.  He was subsequently transferred over to this facility for further management.   Reason for Visit: Acute respiratory disease due to COVID-19  Consultants: None  Procedures: None  Antibiotics: He was on ceftriaxone and azithromycin at the outside facility.  Not continued.  Subjective/Interval History: Patient much more engaging this morning.  Still a little distracted.  Denies any shortness of breath or chest pain.  No nausea vomiting.  Last episode of nonsustained VT was yesterday afternoon.     Assessment/Plan:  Acute Hypoxic Resp. Failure due to Acute Covid 19 Viral Illness  COVID-19 Labs  Recent Labs    12/28/18 0235 12/29/18 0305 12/30/18 0244  DDIMER 5.19* 3.91* 3.79*  FERRITIN 347* 361* 349*  CRP 1.0* 0.9 <0.8    Fever:  Remains afebrile Oxygen requirements: Nasal cannula.  2 L/min.  Saturating in the 90s. Antibacterials: Not continued in this hospital Remdesivir: Day 4 today Steroids: Solu-Medrol 60 mg every 12 hours.  Will start tapering. Diuretics: Patient takes Lasix on a regular basis at home.  Given intravenous dose on 8/6. Actemra: Not indicated Vitamin C and Zinc: Continue DVT Prophylaxis:  Lovenox 40 mg every 24 hours  Research Studies: Not enrolled into any research studies yet  Patient's respiratory status continues to improve.  His oxygen requirements have been improving.  Inflammatory markers are better.  Incentive spirometry and mobilization.  PT and OT evaluation.  Continue Remdesivir.  Start tapering steroids.  The treatment plan and use of medications and known side effects were discussed with family (patient unable to participate due to encephalopathy).  Some of the medications used are based on case reports/anecdotal data which are not peer-reviewed and has not been studied using randomized control trials.  Complete risks and long-term side effects are unknown, however in the best clinical judgment they seem to be of some benefit.  Family agrees with the treatment plan and want to receive these treatments as indicated.  His brother who is the healthcare power of attorney is agreeable to Actemra if needed.  Acute metabolic encephalopathy Most likely due to combination of acute illness as well as his history of alcoholism.  Mental status has been improving gradually.  No neurological deficits.  NSVT Noted to have multiple episodes of nonsustained ventricular  tachycardia on 8/6.  Patient remained asymptomatic.  EKG did not show any concerning findings. Potassium was supplemented.  Patient was started on beta-blocker.  Frequency of nonsustained VT has improved.  No echocardiogram in our system.  Apparently he is followed by a cardiologist in [place].  According to care everywhere he had a stress  test in 2017 which showed normal systolic function at that time.  Patient will need outpatient follow-up with cardiology.  History of coronary artery disease He has had stent placement previously.  Followed by cardiologist in [place].  Continue aspirin, Plavix and statin.  Beta-blockers initiated as discussed above.    Macrocytic anemia No evidence of overt bleeding. Anemia panel reviewed.  No clear deficiencies identified.  Continue vitamin B12.  Hemoglobin is stable.  History of hypothyroidism Continue levothyroxine.  TSH is low most likely due to sick euthyroid.  Free T4 is normal at 0.96.  History of vitamin B12 deficiency Continue vitamin B12.  History of alcohol abuse with alcohol withdrawal syndrome at the outside hospital No evidence for withdrawal at this time.  Continue thiamine multivitamins folic acid.  Has been on CIWA protocol.  DVT Prophylaxis: Lovenox PUD Prophylaxis: Protonix Code Status: Full code Family Communication: Discussed with patient.  Her brother being updated on a daily basis.   Disposition Plan: Apparently has been living with a sister for the last 1 month.  PT and OT evaluation.    Medications:  Scheduled: . aspirin EC  81 mg Oral Daily  . atorvastatin  10 mg Oral Daily  . clopidogrel  75 mg Oral Daily  . enoxaparin (LOVENOX) injection  40 mg Subcutaneous BID  . folic acid  1 mg Oral Daily  . Ipratropium-Albuterol  1 puff Inhalation Q6H  . levothyroxine  150 mcg Oral Daily  . methylPREDNISolone (SOLU-MEDROL) injection  60 mg Intravenous Q12H  . metoprolol tartrate  25 mg Oral BID  . multivitamin with minerals  1 tablet Oral Daily  . nicotine  14 mg Transdermal Daily  . pantoprazole  40 mg Oral Daily  . senna  1 tablet Oral BID  . sodium chloride flush  3 mL Intravenous Q12H  . thiamine  100 mg Oral Daily  . vitamin B-12  500 mcg Oral Daily  . vitamin C  500 mg Oral Daily  . zinc sulfate  220 mg Oral Daily   Continuous: . sodium chloride  Stopped (12/28/18 1858)  . remdesivir 100 mg in NS 250 mL 100 mg (12/29/18 1805)   TFT:DDUKGU chloride, acetaminophen, guaiFENesin-dextromethorphan, LORazepam **OR** [DISCONTINUED] LORazepam, polyethylene glycol, sodium chloride flush   Objective:  Vital Signs  Vitals:   12/29/18 2000 12/30/18 0000 12/30/18 0400 12/30/18 0741  BP:  133/70 131/72 126/72  Pulse:  60 65 69  Resp:  13 14 13   Temp: 97.8 F (36.6 C) 97.8 F (36.6 C) 98.3 F (36.8 C) 98.6 F (37 C)  TempSrc: Oral Oral Oral Oral  SpO2: 90% 96% 94% 90%  Weight:      Height:        Intake/Output Summary (Last 24 hours) at 12/30/2018 1017 Last data filed at 12/30/2018 0500 Gross per 24 hour  Intake 733 ml  Output 1000 ml  Net -267 ml   Filed Weights   12/27/18 1600  Weight: 93 kg    General appearance: Awake alert.  In no distress.  Mildly distracted Resp: Coarse breath sounds bilaterally.  Crackles at the bases.  Improved aeration.  No wheezing or rhonchi. Cardio: S1-S2  is normal regular.  No S3-S4.  No rubs murmurs or bruit.  Telemetry last showed nonsustained VT yesterday afternoon. GI: Abdomen is soft.  Nontender nondistended.  Bowel sounds are present normal.  No masses organomegaly Extremities: No edema.  Full range of motion of lower extremities. Neurologic: Oriented to city month.  Keeps getting the year wrong.  Oriented to person.  No obvious focal neurological deficits.     Lab Results:  Data Reviewed: I have personally reviewed following labs and imaging studies  CBC: Recent Labs  Lab 12/28/18 0235 12/29/18 0305 12/30/18 0244  WBC 11.1* 11.1* 11.8*  NEUTROABS 9.8* 9.7* 10.4*  HGB 12.5* 12.8* 12.4*  HCT 37.4* 37.8* 37.8*  MCV 100.3* 98.7 100.3*  PLT 217 238 622    Basic Metabolic Panel: Recent Labs  Lab 12/28/18 0235 12/28/18 1648 12/29/18 0305 12/30/18 0244  NA 137 138 139 136  K 4.2 3.8 4.1 4.0  CL 98 98 102 100  CO2 28 28 28 27   GLUCOSE 94 123* 120* 122*  BUN 18 22 21  24*   CREATININE 1.07 1.03 0.96 0.97  CALCIUM 9.1 9.0 8.9 8.8*  MG 2.0 2.0 2.0 2.1    GFR: Estimated Creatinine Clearance: 82.9 mL/min (by C-G formula based on SCr of 0.97 mg/dL).  Liver Function Tests: Recent Labs  Lab 12/28/18 0235 12/29/18 0305 12/30/18 0244  AST 39 29 23  ALT 28 29 27   ALKPHOS 59 56 52  BILITOT 0.5 0.5 0.8  PROT 6.2* 6.1* 5.9*  ALBUMIN 2.9* 2.9* 2.7*    Thyroid Function Tests: Recent Labs    12/28/18 0235  TSH 0.202*  FREET4 0.96    Anemia Panel: Recent Labs    12/28/18 0235 12/29/18 0305 12/30/18 0244  VITAMINB12 410  --   --   FOLATE 20.0  --   --   FERRITIN 347* 361* 349*  TIBC 213*  --   --   IRON 32*  --   --   RETICCTPCT 0.4  --   --       Radiology Studies: No results found.     LOS: 3 days   [place] [employer] on [email or web address]  12/30/2018, 10:17 AM

## 2018-12-30 NOTE — Plan of Care (Signed)
POC reviewed with patient and family

## 2018-12-30 NOTE — Progress Notes (Signed)
Report received from [place], dayshift RN during BSR. POC reviewed with patient and family. Pt denies pain, nausea and SOB. VSS, he has ambulated to bathroom and back with assistance and had BM. O2 sats remained above 90%. Call light within reaech

## 2018-12-31 LAB — COMPREHENSIVE METABOLIC PANEL
ALT: 32 U/L (ref 0–44)
AST: 28 U/L (ref 15–41)
Albumin: 2.7 g/dL — ABNORMAL LOW (ref 3.5–5.0)
Alkaline Phosphatase: 52 U/L (ref 38–126)
Anion gap: 9 (ref 5–15)
BUN: 28 mg/dL — ABNORMAL HIGH (ref 8–23)
CO2: 28 mmol/L (ref 22–32)
Calcium: 8.8 mg/dL — ABNORMAL LOW (ref 8.9–10.3)
Chloride: 102 mmol/L (ref 98–111)
Creatinine, Ser: 0.93 mg/dL (ref 0.61–1.24)
GFR calc Af Amer: >60 mL/min (ref >60)
GFR calc non Af Amer: >60 mL/min (ref >60)
Glucose, Bld: 90 mg/dL (ref 70–99)
Potassium: 4.1 mmol/L (ref 3.5–5.1)
Sodium: 139 mmol/L (ref 135–145)
Total Bilirubin: 0.5 mg/dL (ref 0.3–1.2)
Total Protein: 5.8 g/dL — ABNORMAL LOW (ref 6.5–8.1)

## 2018-12-31 LAB — CBC WITH DIFFERENTIAL/PLATELET
Abs Immature Granulocytes: 0.06 10*3/uL (ref 0.00–0.07)
Basophils Absolute: 0 10*3/uL (ref 0.0–0.1)
Basophils Relative: 0 %
Eosinophils Absolute: 0 10*3/uL (ref 0.0–0.5)
Eosinophils Relative: 0 %
HCT: 38.6 % — ABNORMAL LOW (ref 39.0–52.0)
Hemoglobin: 12.9 g/dL — ABNORMAL LOW (ref 13.0–17.0)
Immature Granulocytes: 1 %
Lymphocytes Relative: 9 %
Lymphs Abs: 1 10*3/uL (ref 0.7–4.0)
MCH: 33.3 pg (ref 26.0–34.0)
MCHC: 33.4 g/dL (ref 30.0–36.0)
MCV: 99.7 fL (ref 80.0–100.0)
Monocytes Absolute: 1 10*3/uL (ref 0.1–1.0)
Monocytes Relative: 9 %
Neutro Abs: 9.2 10*3/uL — ABNORMAL HIGH (ref 1.7–7.7)
Neutrophils Relative %: 81 %
Platelets: 308 10*3/uL (ref 150–400)
RBC: 3.87 MIL/uL — ABNORMAL LOW (ref 4.22–5.81)
RDW: 12.2 % (ref 11.5–15.5)
WBC: 11.1 10*3/uL — ABNORMAL HIGH (ref 4.0–10.5)
nRBC: 0 % (ref 0.0–0.2)

## 2018-12-31 LAB — FERRITIN: Ferritin: 362 ng/mL — ABNORMAL HIGH (ref 24–336)

## 2018-12-31 LAB — MAGNESIUM: Magnesium: 2.2 mg/dL (ref 1.7–2.4)

## 2018-12-31 LAB — D-DIMER, QUANTITATIVE: D-Dimer, Quant: 5.88 ug/mL-FEU — ABNORMAL HIGH (ref 0.00–0.50)

## 2018-12-31 LAB — C-REACTIVE PROTEIN: CRP: <0.8 mg/dL (ref <1.0)

## 2018-12-31 MED ORDER — TRAZODONE HCL 50 MG PO TABS
50.0000 mg | ORAL_TABLET | Freq: Every day | ORAL | Status: DC
  Administered 2018-12-31 – 2019-01-12 (×13): 50 mg via ORAL
  Filled 2018-12-31 (×13): qty 1

## 2018-12-31 MED ORDER — PREDNISONE 20 MG PO TABS
40.0000 mg | ORAL_TABLET | Freq: Every day | ORAL | Status: DC
  Administered 2019-01-01 – 2019-01-11 (×11): 40 mg via ORAL
  Filled 2018-12-31 (×11): qty 2

## 2018-12-31 NOTE — Progress Notes (Signed)
PROGRESS NOTE  Billy Byrd ERD:[id number] DOB: 01/03/1941 DOA: 12/27/2018  PCP: Orpah Cobb., MD  Brief History/Interval Summary: 78 y.o. male with a past medical history of coronary artery disease, status post PCI more than 2 years ago, on aspirin and Plavix, history of hypothyroidism, history of alcohol abuse, history of tobacco abuse who has been living with a sister for the past 1 month.  It is not clear why he moved in with his sister but apparently has been having frequent falls and has not been able to take care of himself, presumably due to his issues with alcoholism.  Patient apparently goes to a "club" where he consumes alcohol.  It is thought that that is where he contracted COVID-19.  He has been sick for about 5 to 6 days.  He was taken to the hospital in [facility] on Saturday and was hospitalized.  He was started on antibacterial agents.  Subsequently the COVID-19 test came back as being positive.  His oxygenation has worsened over the last 24 hours.  He was initially on 2 L of oxygen subsequently went to 6 L and then 10 L this morning.  Chest x-ray was repeated this morning which showed worsening bilateral infiltrates.  He was subsequently transferred over to this facility for further management.   Reason for Visit: Acute respiratory disease due to COVID-19  Consultants: None  Procedures: None  Antibiotics: He was on ceftriaxone and azithromycin at the outside facility.  Not continued.  Subjective/Interval History: Patient apparently did not sleep much overnight.  He denies any symptoms this morning.  States overall that he is feeling better.  Shortness of breath has improved.      Assessment/Plan:  Acute Hypoxic Resp. Failure due to Acute Covid 19 Viral Illness  COVID-19 Labs  Recent Labs    12/29/18 0305 12/30/18 0244 12/31/18 0223  DDIMER 3.91* 3.79* 5.88*  FERRITIN 361* 349* 362*  CRP 0.9 <0.8 <0.8    Fever: Remains afebrile Oxygen  requirements: Nasal cannula.  2 L/min.  The 90s.  Wean down as tolerated. Antibacterials: None Remdesivir: Day 5 today Steroids: Solu-Medrol.  Being tapered. Diuretics: Patient takes Lasix on a regular basis at home.  Given intravenous dose on 8/6. Actemra: Not indicated Vitamin C and Zinc: Continue DVT Prophylaxis:  Lovenox 40 mg every 24 hours  Research Studies: Not enrolled into any research studies yet  Patient's respiratory status continues to improve.  His oxygen requirements have improved.  Inflammatory markers are better.  He will complete the course of Remdesivir today.  Steroids being tapered.  PT and OT evaluation.  Incentive spirometry and mobilization.  Due to his clinical improvement he did not require off label use of Actemra.   Acute metabolic encephalopathy Most likely due to combination of acute illness as well as his history of alcoholism.  Mental status has been improving.  He could be close to his baseline now.  No neurological deficits.    NSVT Noted to have multiple episodes of nonsustained ventricular tachycardia on 8/6.  Patient remained asymptomatic.  EKG did not show any concerning findings. Potassium was supplemented.  Patient was started on beta-blocker.  Frequency of NSVT appears to have improved.  He has not had any episodes in the last 36 hours.   No echocardiogram in our system.  Apparently he is followed by a cardiologist in [place].  According to care everywhere he had a stress test in 2017 which showed normal systolic function at that time.  Patient will need outpatient follow-up with cardiology.  History of coronary artery disease He has had stent placement previously.  Followed by cardiologist in [place].  Continue aspirin, Plavix and statin.  Beta-blockers initiated as discussed above.  Stable from a cardiac standpoint.  Macrocytic anemia No evidence of overt bleeding. Anemia panel reviewed.  No clear deficiencies identified.  Continue vitamin B12.   Hemoglobin is stable  History of hypothyroidism Continue levothyroxine.  TSH is low most likely due to sick euthyroid.  Free T4 is normal at 0.96.  History of vitamin B12 deficiency Continue vitamin B12.  History of alcohol abuse with alcohol withdrawal syndrome at the outside hospital No evidence for withdrawal at this time.  Continue thiamine multivitamins folic acid.  Has been on CIWA protocol.  DVT Prophylaxis: Lovenox PUD Prophylaxis: Protonix Code Status: Full code Family Communication: Discussed with patient.  Her brother being updated on a daily basis.   Disposition Plan: Hopefully will be able to return home when medically improved.  Apparently he was staying with his sister.  Will need to clarify this with family.    Medications:  Scheduled: . aspirin EC  81 mg Oral Daily  . atorvastatin  10 mg Oral Daily  . clopidogrel  75 mg Oral Daily  . enoxaparin (LOVENOX) injection  40 mg Subcutaneous BID  . folic acid  1 mg Oral Daily  . Ipratropium-Albuterol  1 puff Inhalation Q6H  . levothyroxine  150 mcg Oral Daily  . methylPREDNISolone (SOLU-MEDROL) injection  60 mg Intravenous Q24H  . metoprolol tartrate  25 mg Oral BID  . multivitamin with minerals  1 tablet Oral Daily  . nicotine  14 mg Transdermal Daily  . pantoprazole  40 mg Oral Daily  . senna  1 tablet Oral BID  . sodium chloride flush  3 mL Intravenous Q12H  . thiamine  100 mg Oral Daily  . traZODone  50 mg Oral QHS  . vitamin B-12  500 mcg Oral Daily  . vitamin C  500 mg Oral Daily  . zinc sulfate  220 mg Oral Daily   Continuous: . sodium chloride Stopped (12/28/18 1858)  . remdesivir 100 mg in NS 250 mL Stopped (12/30/18 1900)   MPN:TIRWER chloride, acetaminophen, guaiFENesin-dextromethorphan, polyethylene glycol, sodium chloride flush   Objective:  Vital Signs  Vitals:   12/31/18 0400 12/31/18 0500 12/31/18 0800 12/31/18 0900  BP: 130/80  136/77 127/77  Pulse: 77  62 80  Resp:   10 14  Temp: 98  F (36.7 C)   98.1 F (36.7 C)  TempSrc: Oral   Oral  SpO2: 94%  96% 92%  Weight:  92.3 kg    Height:        Intake/Output Summary (Last 24 hours) at 12/31/2018 1027 Last data filed at 12/31/2018 0400 Gross per 24 hour  Intake 360 ml  Output 1001 ml  Net -641 ml   Filed Weights   12/27/18 1600 12/31/18 0500  Weight: 93 kg 92.3 kg     General appearance: Awake alert.  In no distress.  Mildly distracted Resp: Normal effort at rest.  Coarse breath sounds bilaterally.  No wheezing rales or rhonchi.   Cardio: S1-S2 is normal regular.  No S3-S4.  No rubs murmurs or bruit GI: Abdomen is soft.  Nontender nondistended.  Bowel sounds are present normal.  No masses organomegaly Extremities: No edema.  Full range of motion of lower extremities. Neurologic: Oriented to city, month.  Oriented to person.  No obvious  focal neurological deficits     Lab Results:  Data Reviewed: I have personally reviewed following labs and imaging studies  CBC: Recent Labs  Lab 12/28/18 0235 12/29/18 0305 12/30/18 0244 12/31/18 0223  WBC 11.1* 11.1* 11.8* 11.1*  NEUTROABS 9.8* 9.7* 10.4* 9.2*  HGB 12.5* 12.8* 12.4* 12.9*  HCT 37.4* 37.8* 37.8* 38.6*  MCV 100.3* 98.7 100.3* 99.7  PLT 217 238 301 128    Basic Metabolic Panel: Recent Labs  Lab 12/28/18 0235 12/28/18 1648 12/29/18 0305 12/30/18 0244 12/31/18 0223  NA 137 138 139 136 139  K 4.2 3.8 4.1 4.0 4.1  CL 98 98 102 100 102  CO2 28 28 28 27 28   GLUCOSE 94 123* 120* 122* 90  BUN 18 22 21  24* 28*  CREATININE 1.07 1.03 0.96 0.97 0.93  CALCIUM 9.1 9.0 8.9 8.8* 8.8*  MG 2.0 2.0 2.0 2.1 2.2    GFR: Estimated Creatinine Clearance: 86.5 mL/min (by C-G formula based on SCr of 0.93 mg/dL).  Liver Function Tests: Recent Labs  Lab 12/28/18 0235 12/29/18 0305 12/30/18 0244 12/31/18 0223  AST 39 29 23 28   ALT 28 29 27  32  ALKPHOS 59 56 52 52  BILITOT 0.5 0.5 0.8 0.5  PROT 6.2* 6.1* 5.9* 5.8*  ALBUMIN 2.9* 2.9* 2.7* 2.7*      Anemia Panel: Recent Labs    12/30/18 0244 12/31/18 0223  FERRITIN 349* 362*      Radiology Studies: No results found.     LOS: 4 days   Lori Liew [employer] on [email or web address]  12/31/2018, 10:27 AM

## 2018-12-31 NOTE — Progress Notes (Signed)
Pt resting quielty in bed, report given by Kathlee [place], dayshift RN during BSR. Pt is using urinal appropriately. He denies pain, nausea and SOB. No needs voiced, family updated today, bed alarm is on, calllight within reach

## 2018-12-31 NOTE — Plan of Care (Signed)
POC reviewed with patient 

## 2018-12-31 NOTE — Progress Notes (Signed)
Pt has been awake t/o most of the night. He is somewhat confused but follows commands appropriately. Bed alarm is on

## 2018-12-31 NOTE — Progress Notes (Addendum)
Called and spoke with pt's brother Alvester Chou, provided plan of care and answered questions. Encouraged questions and provided answers.  Requested Alvester Chou to provide information to his family. Verbalizes understanding and states he will call family.

## 2019-01-01 LAB — COMPREHENSIVE METABOLIC PANEL
ALT: 29 U/L (ref 0–44)
AST: 24 U/L (ref 15–41)
Albumin: 2.8 g/dL — ABNORMAL LOW (ref 3.5–5.0)
Alkaline Phosphatase: 60 U/L (ref 38–126)
Anion gap: 10 (ref 5–15)
BUN: 26 mg/dL — ABNORMAL HIGH (ref 8–23)
CO2: 27 mmol/L (ref 22–32)
Calcium: 8.7 mg/dL — ABNORMAL LOW (ref 8.9–10.3)
Chloride: 98 mmol/L (ref 98–111)
Creatinine, Ser: 0.96 mg/dL (ref 0.61–1.24)
GFR calc Af Amer: >60 mL/min (ref >60)
GFR calc non Af Amer: >60 mL/min (ref >60)
Glucose, Bld: 80 mg/dL (ref 70–99)
Potassium: 4 mmol/L (ref 3.5–5.1)
Sodium: 135 mmol/L (ref 135–145)
Total Bilirubin: 0.6 mg/dL (ref 0.3–1.2)
Total Protein: 5.9 g/dL — ABNORMAL LOW (ref 6.5–8.1)

## 2019-01-01 LAB — D-DIMER, QUANTITATIVE: D-Dimer, Quant: 3.98 ug/mL-FEU — ABNORMAL HIGH (ref 0.00–0.50)

## 2019-01-01 LAB — MAGNESIUM: Magnesium: 2.1 mg/dL (ref 1.7–2.4)

## 2019-01-01 NOTE — Progress Notes (Signed)
Occupational Therapy Treatment Patient Details Name: Billy Byrd MRN: [id number] DOB: [birth year] Today's Date: 01/01/2019    History of present illness Pt is a 78 y.o. male admitted 12/27/18 with SOB and worsening oxygenation; COVID-19 positive. CXR with worsening bilateral infiltrates. Pt also with acute metabolic encephalopathy. PMH includes CAD, ETOH/tobacco abuse, OSA.   OT comments  Upon arrival, pt supine in bed with nasal cannula twisted away from his nose and SpO2 95% on RA. Pt continues to present with poor cognition, safety, balance, and strength. Pt's daughter calling at beginning of session and pt reports he plans to dc home alone, have his sister deliver meals to the front porch, and will drive himself home from the hospital demonstrating poor awareness of his current deficits and functional performance. Pt performing short distance mobility to sink with Min A. He then completes oral care at sink with Max cues for sequencing each step of task and Min Guard A for safety at sink. Pt with significant fatigue after simple grooming tasks; SpO2 >87% on RA. Due to new information on home support (called daughter who confirmed pt will not have assistance at home), update dc recommendation to SNF for ST-rehab as pt has a high risks of falls and poor activity tolerance to perform BADLs. Will continue to follow acutely as admitted to facilitate safe dc and increase safety with ADLs.    Follow Up Recommendations  SNF;Supervision/Assistance - 24 hour    Equipment Recommendations  3 in 1 bedside commode(to be further assessed)    Recommendations for Other Services      Precautions / Restrictions Precautions Precautions: Fall Precaution Comments: Tremulous Restrictions Weight Bearing Restrictions: No       Mobility Bed Mobility Overal bed mobility: Needs Assistance Bed Mobility: Supine to Sit     Supine to sit: Min guard     General bed mobility comments: Min Guard A for  safety  Transfers Overall transfer level: Needs assistance Equipment used: None Transfers: Sit to/from Stand Sit to Stand: Min assist         General transfer comment: Min A to gain balance in standing    Balance Overall balance assessment: Needs assistance Sitting-balance support: Feet supported Sitting balance-Leahy Scale: Fair     Standing balance support: No upper extremity supported;During functional activity Standing balance-Leahy Scale: Fair Standing balance comment: able to static stand with close minguard for safety                           ADL either performed or assessed with clinical judgement   ADL Overall ADL's : Needs assistance/impaired     Grooming: Oral care;Min guard;Standing Grooming Details (indicate cue type and reason): Pt performing oral care with Max cues for sequencing and Min Guard A for safety and balance. Pt fatigues quickly and demonstrates poor safety awareness and quickly moving towards chair.             Lower Body Dressing: Minimal assistance;Sit to/from stand Lower Body Dressing Details (indicate cue type and reason): Pt donning shoes while seated at EOB and bending forwards to place heel into shoe Toilet Transfer: Minimal assistance;Ambulation(simulated to recliner) Toilet Transfer Details (indicate cue type and reason): Min A for balance and safety with short distance ambulation.         Functional mobility during ADLs: Minimal assistance(short distance; declines RW) General ADL Comments: Pt presenting with poor cognition, strength, and balance     Vision  Perception     Praxis      Cognition Arousal/Alertness: Awake/alert Behavior During Therapy: WFL for tasks assessed/performed Overall Cognitive Status: No family/caregiver present to determine baseline cognitive functioning Area of Impairment: Orientation;Attention;Memory;Following commands;Safety/judgement;Awareness;Problem solving                  Orientation Level: Disoriented to;Situation Current Attention Level: Sustained;Selective Memory: Decreased short-term memory Following Commands: Follows one step commands with increased time;Follows multi-step commands inconsistently Safety/Judgement: Decreased awareness of deficits;Decreased awareness of safety Awareness: Intellectual Problem Solving: Requires verbal cues;Slow processing General Comments: Throughout simple oral care task, pt requiring VCs for sequencing each step (ie. pick up tooth brush, do you need to rinse, where is the tooth paste cap?) Pt with poor awareness of deficits as seen by conversation with daughter on the phone as pt staes he will drive himself home from the hospital. Pt also demonstrating poor memory as we discussed plan of therapy prior to his daughter calling then he was unable to recall plan to get OOB and go to sink for oral care.        Exercises     Shoulder Instructions       General Comments SpO2 >87% on RA. Cues for breathing     Pertinent Vitals/ Pain       Pain Assessment: No/denies pain  Home Living                                          Prior Functioning/Environment              Frequency  Min 3X/week        Progress Toward Goals  OT Goals(current goals can now be found in the care plan section)  Progress towards OT goals: Progressing toward goals  Acute Rehab OT Goals Patient Stated Goal: Get my house built how I want it OT Goal Formulation: With patient Time For Goal Achievement: 01/11/19 Potential to Achieve Goals: Good ADL Goals Pt Will Perform Grooming: standing;with supervision Pt Will Perform Lower Body Bathing: sit to/from stand;with supervision Pt Will Perform Upper Body Dressing: with modified independence;sitting Pt Will Perform Lower Body Dressing: sit to/from stand;with supervision Pt Will Transfer to Toilet: ambulating;with supervision Pt Will Perform Toileting - Clothing  Manipulation and hygiene: with supervision;sit to/from stand Pt/caregiver will Perform Home Exercise Program: Increased strength;Both right and left upper extremity;With theraband;With written HEP provided Additional ADL Goal #1: Pt will demonstrate anticipatory awareness during functional task.  Plan Discharge plan needs to be updated;Other (comment)(Pt will not have assistance at home)    Co-evaluation                 AM-PAC OT "6 Clicks" Daily Activity     Outcome Measure   Help from another person eating meals?: A Little Help from another person taking care of personal grooming?: A Little Help from another person toileting, which includes using toliet, bedpan, or urinal?: A Little Help from another person bathing (including washing, rinsing, drying)?: A Little Help from another person to put on and taking off regular upper body clothing?: A Little Help from another person to put on and taking off regular lower body clothing?: A Little 6 Click Score: 18    End of Session    OT Visit Diagnosis: Unsteadiness on feet (R26.81);Other symptoms and signs involving cognitive function;Muscle weakness (generalized) (M62.81)   Activity Tolerance  Patient tolerated treatment well   Patient Left in chair;with call bell/phone within reach;with chair alarm set   Nurse Communication Mobility status        Time: 4656-8127 OT Time Calculation (min): 31 min  Charges: OT General Charges $OT Visit: 1 Visit OT Treatments $Self Care/Home Management : 23-37 mins  [place], OTR/L Acute Rehab Pager: [phone] Office: [place] 01/01/2019, 1:31 PM

## 2019-01-01 NOTE — Progress Notes (Signed)
Physical Therapy Treatment Patient Details Name: Billy Byrd MRN: [id number] DOB: [birth year] Today's Date: 01/01/2019    History of Present Illness Pt is a 78 y.o. male admitted 12/27/18 with SOB and worsening oxygenation; COVID-19 positive. CXR with worsening bilateral infiltrates. Pt also with acute metabolic encephalopathy. PMH includes CAD, ETOH/tobacco abuse, OSA.    PT Comments    Pt continues to require assist for mobility. Per OT conversation pt doesn't have family support at home currently and would be home alone. Pt unable to manage this. Will change recommendation to SNF.    Follow Up Recommendations  SNF;Supervision/Assistance - 24 hour     Equipment Recommendations  None recommended by PT    Recommendations for Other Services       Precautions / Restrictions Precautions Precautions: Fall Precaution Comments: Tremulous Restrictions Weight Bearing Restrictions: No    Mobility  Bed Mobility Overal bed mobility: Needs Assistance Bed Mobility: Supine to Sit     Supine to sit: Min guard     General bed mobility comments: Pt up in chair  Transfers Overall transfer level: Needs assistance Equipment used: None;Rolling walker (2 wheeled) Transfers: Sit to/from Stand Sit to Stand: Min assist         General transfer comment: Assist to bring hips up from low recliner and for balance. Verbal cues for hand placement  Ambulation/Gait Ambulation/Gait assistance: Min assist Gait Distance (Feet): 100 Feet Assistive device: 1 person hand held assist;Rolling walker (2 wheeled) Gait Pattern/deviations: Step-through pattern;Decreased stride length;Trunk flexed Gait velocity: decr Gait velocity interpretation: <1.8 ft/sec, indicate of risk for recurrent falls General Gait Details: Assist for balance and support. Pt used walker for portion of the walk and then with hand held. Amb on RA with SpO2 86%. Returned to 90% after 1 minute sitting rest break.   Stairs             Wheelchair Mobility    Modified Rankin (Stroke Patients Only)       Balance Overall balance assessment: Needs assistance Sitting-balance support: Feet supported Sitting balance-Leahy Scale: Fair     Standing balance support: No upper extremity supported;During functional activity Standing balance-Leahy Scale: Fair Standing balance comment: able to static stand with close minguard for safety                            Cognition Arousal/Alertness: Awake/alert Behavior During Therapy: WFL for tasks assessed/performed Overall Cognitive Status: No family/caregiver present to determine baseline cognitive functioning Area of Impairment: Orientation;Attention;Memory;Following commands;Safety/judgement;Awareness;Problem solving                 Orientation Level: Disoriented to;Situation Current Attention Level: Sustained;Selective Memory: Decreased short-term memory Following Commands: Follows one step commands with increased time;Follows multi-step commands inconsistently Safety/Judgement: Decreased awareness of deficits;Decreased awareness of safety Awareness: Intellectual Problem Solving: Requires verbal cues;Slow processing General Comments: Throughout simple oral care task, pt requiring VCs for sequencing each step (ie. pick up tooth brush, do you need to rinse, where is the tooth paste cap?) Pt with poor awareness of deficits as seen by conversation with daughter on the phone as pt staes he will drive himself home from the hospital. Pt also demonstrating poor memory as we discussed plan of therapy prior to his daughter calling then he was unable to recall plan to get OOB and go to sink for oral care.      Exercises      General Comments General comments (skin integrity, edema,  etc.): SpO2 >87% on RA. Cues for breathing       Pertinent Vitals/Pain Pain Assessment: No/denies pain    Home Living                      Prior Function             PT Goals (current goals can now be found in the care plan section) Acute Rehab PT Goals Patient Stated Goal: Get my house built how I want it Progress towards PT goals: Progressing toward goals    Frequency    Min 3X/week      PT Plan Discharge plan needs to be updated    Co-evaluation              AM-PAC PT "6 Clicks" Mobility   Outcome Measure  Help needed turning from your back to your side while in a flat bed without using bedrails?: A Little Help needed moving from lying on your back to sitting on the side of a flat bed without using bedrails?: A Little Help needed moving to and from a bed to a chair (including a wheelchair)?: A Little Help needed standing up from a chair using your arms (e.g., wheelchair or bedside chair)?: A Little Help needed to walk in hospital room?: A Little Help needed climbing 3-5 steps with a railing? : A Little 6 Click Score: 18    End of Session Equipment Utilized During Treatment: Gait belt Activity Tolerance: Patient tolerated treatment well Patient left: in chair;with call bell/phone within reach;with chair alarm set Nurse Communication: Mobility status(tech) PT Visit Diagnosis: Unsteadiness on feet (R26.81);Muscle weakness (generalized) (M62.81);Other abnormalities of gait and mobility (R26.89)     Time: 1550-1610 PT Time Calculation (min) (ACUTE ONLY): 20 min  Charges:  $Gait Training: 8-22 mins                     Joiner Pager [phone] Office [place] 01/01/2019, 4:32 PM

## 2019-01-01 NOTE — Progress Notes (Signed)
PROGRESS NOTE  Billy Byrd WUJ:[id number] DOB: 18-Oct-1940 DOA: 12/27/2018  PCP: Orpah Cobb., MD  Brief History/Interval Summary: 78 y.o. male with a past medical history of coronary artery disease, status post PCI more than 2 years ago, on aspirin and Plavix, history of hypothyroidism, history of alcohol abuse, history of tobacco abuse who has been living with a sister for the past 1 month.  It is not clear why he moved in with his sister but apparently has been having frequent falls and has not been able to take care of himself, presumably due to his issues with alcoholism.  Patient apparently goes to a "club" where he consumes alcohol.  It is thought that that is where he contracted COVID-19.  He has been sick for about 5 to 6 days.  He was taken to the hospital in [facility] on Saturday and was hospitalized.  He was started on antibacterial agents.  Subsequently the COVID-19 test came back as being positive.  His oxygenation has worsened over the last 24 hours.  He was initially on 2 L of oxygen subsequently went to 6 L and then 10 L this morning.  Chest x-ray was repeated this morning which showed worsening bilateral infiltrates.  He was subsequently transferred over to this facility for further management.   Reason for Visit: Acute respiratory disease due to COVID-19  Consultants: None  Procedures: None  Antibiotics: He was on ceftriaxone and azithromycin at the outside facility.  Not continued.  Subjective/Interval History: Patient slept better last night.  Denies any shortness of breath this morning.  Appetite is improving.     Assessment/Plan:  Acute Hypoxic Resp. Failure due to Acute Covid 19 Viral Illness  COVID-19 Labs  Recent Labs    12/30/18 0244 12/31/18 0223 01/01/19 0252  DDIMER 3.79* 5.88* 3.98*  FERRITIN 349* 362*  --   CRP <0.8 <0.8  --     Fever: Remains afebrile Oxygen requirements: Nasal cannula.  2 L/min.  Saturating in the 90s.     Antibacterials: None Remdesivir: Completed course Steroids: Initially on Solu-Medrol.  Changed over to prednisone. Diuretics: Patient takes Lasix on a regular basis at home.  Given intravenous dose on 8/6. Actemra: Not indicated Vitamin C and Zinc: Continue DVT Prophylaxis:  Lovenox 40 mg every 24 hours  Patient's respiratory status continues to improve.  He is now on 2 L of oxygen by nasal cannula.  Inflammatory markers had improved.  D-dimer is improving.  Completed course of Remdesivir yesterday.  Appetite is improved.  Mentation also appears to be improving.  Continue incentive spirometry mobilization.  Reevaluate chin by PT and OT today.  Anticipate discharge tomorrow.   Acute metabolic encephalopathy Most likely due to combination of acute illness as well as his history of alcoholism.  Mental status has been improving.  Seems to be close to baseline.  No neurological deficits.    NSVT Noted to have multiple episodes of nonsustained ventricular tachycardia on 8/6.  Patient remained asymptomatic.  EKG did not show any concerning findings. Potassium was supplemented.  Patient was started on beta-blocker.  No further episodes of nonsustained ventricular tachycardia in the last 2 days.  Apparently he is followed by a cardiologist in [place].  According to care everywhere he had a stress test in 2017 which showed normal systolic function at that time.  Patient will need outpatient follow-up with cardiology.  History of coronary artery disease He has had stent placement previously.  Followed by cardiologist in  [place].  Continue aspirin, Plavix and statin.  Beta-blockers initiated as discussed above.  Remained stable from a cardiac standpoint.  Macrocytic anemia No evidence of overt bleeding. Anemia panel reviewed.  No clear deficiencies identified.  Continue vitamin B12.  Hemoglobin is stable  History of hypothyroidism Continue levothyroxine.  TSH is low most likely due to sick euthyroid.   Free T4 is normal at 0.96.  History of vitamin B12 deficiency Continue vitamin B12.  History of alcohol abuse with alcohol withdrawal syndrome at the outside hospital No evidence for withdrawal at this time.  Continue thiamine multivitamins folic acid.  Has been on CIWA protocol.  DVT Prophylaxis: Lovenox PUD Prophylaxis: Protonix Code Status: Full code Family Communication: Discussed with patient.  Her brother being updated on a daily basis.   Disposition Plan: Anticipate discharge tomorrow.  Will discuss with his family as to where he is going to go.      Medications:  Scheduled:  aspirin EC  81 mg Oral Daily   atorvastatin  10 mg Oral Daily   clopidogrel  75 mg Oral Daily   enoxaparin (LOVENOX) injection  40 mg Subcutaneous BID   folic acid  1 mg Oral Daily   Ipratropium-Albuterol  1 puff Inhalation Q6H   levothyroxine  150 mcg Oral Daily   metoprolol tartrate  25 mg Oral BID   multivitamin with minerals  1 tablet Oral Daily   nicotine  14 mg Transdermal Daily   pantoprazole  40 mg Oral Daily   predniSONE  40 mg Oral Q breakfast   senna  1 tablet Oral BID   sodium chloride flush  3 mL Intravenous Q12H   thiamine  100 mg Oral Daily   traZODone  50 mg Oral QHS   vitamin B-12  500 mcg Oral Daily   vitamin C  500 mg Oral Daily   zinc sulfate  220 mg Oral Daily   Continuous:  sodium chloride Stopped (12/28/18 1858)   GYI:RSWNIO chloride, acetaminophen, guaiFENesin-dextromethorphan, polyethylene glycol, sodium chloride flush   Objective:  Vital Signs  Vitals:   12/31/18 1916 01/01/19 0415 01/01/19 0500 01/01/19 0801  BP: 102/68 128/82 128/82 124/85  Pulse:    82  Resp:      Temp: 99.1 F (37.3 C) 98.2 F (36.8 C)  (!) 97.5 F (36.4 C)  TempSrc: Oral Oral  Oral  SpO2: 93%     Weight:      Height:        Intake/Output Summary (Last 24 hours) at 01/01/2019 1014 Last data filed at 12/31/2018 1939 Gross per 24 hour  Intake --  Output 500 ml   Net -500 ml   Filed Weights   12/27/18 1600 12/31/18 0500  Weight: 93 kg 92.3 kg    General appearance: Awake alert.  In no distress Resp: Normal effort at rest.  Coarse breath sounds bilaterally.  No wheezing or rhonchi. Cardio: S1-S2 is normal regular.  No S3-S4.  No rubs murmurs or bruit GI: Abdomen is soft.  Nontender nondistended.  Bowel sounds are present normal.  No masses organomegaly Extremities: No edema.  Full range of motion of lower extremities. Neurologic:   No focal neurological deficits.     Lab Results:  Data Reviewed: I have personally reviewed following labs and imaging studies  CBC: Recent Labs  Lab 12/28/18 0235 12/29/18 0305 12/30/18 0244 12/31/18 0223  WBC 11.1* 11.1* 11.8* 11.1*  NEUTROABS 9.8* 9.7* 10.4* 9.2*  HGB 12.5* 12.8* 12.4* 12.9*  HCT 37.4*  37.8* 37.8* 38.6*  MCV 100.3* 98.7 100.3* 99.7  PLT 217 238 301 889    Basic Metabolic Panel: Recent Labs  Lab 12/28/18 1648 12/29/18 0305 12/30/18 0244 12/31/18 0223 01/01/19 0252  NA 138 139 136 139 135  K 3.8 4.1 4.0 4.1 4.0  CL 98 102 100 102 98  CO2 28 28 27 28 27   GLUCOSE 123* 120* 122* 90 80  BUN 22 21 24* 28* 26*  CREATININE 1.03 0.96 0.97 0.93 0.96  CALCIUM 9.0 8.9 8.8* 8.8* 8.7*  MG 2.0 2.0 2.1 2.2 2.1    GFR: Estimated Creatinine Clearance: 83.8 mL/min (by C-G formula based on SCr of 0.96 mg/dL).  Liver Function Tests: Recent Labs  Lab 12/28/18 0235 12/29/18 0305 12/30/18 0244 12/31/18 0223 01/01/19 0252  AST 39 29 23 28 24   ALT 28 29 27  32 29  ALKPHOS 59 56 52 52 60  BILITOT 0.5 0.5 0.8 0.5 0.6  PROT 6.2* 6.1* 5.9* 5.8* 5.9*  ALBUMIN 2.9* 2.9* 2.7* 2.7* 2.8*     Anemia Panel: Recent Labs    12/30/18 0244 12/31/18 0223  FERRITIN 349* 362*      Radiology Studies: No results found.     LOS: 5 days   Ladarrius Bogdanski [employer] on [email or web address]  01/01/2019, 10:14 AM

## 2019-01-02 MED ORDER — NICOTINE 14 MG/24HR TD PT24: 14.0000 mg | MEDICATED_PATCH | Freq: Every day | TRANSDERMAL | 0 refills | Status: DC

## 2019-01-02 MED ORDER — ZINC SULFATE 220 (50 ZN) MG PO CAPS: 220.0000 mg | ORAL_CAPSULE | Freq: Every day | ORAL | 0 refills | Status: DC

## 2019-01-02 MED ORDER — POLYETHYLENE GLYCOL 3350 17 G PO PACK: 17.0000 g | PACK | Freq: Every day | ORAL | 0 refills | Status: DC | PRN

## 2019-01-02 MED ORDER — ADULT MULTIVITAMIN W/MINERALS CH: 1.0000 | ORAL_TABLET | Freq: Every day | ORAL | Status: DC

## 2019-01-02 MED ORDER — TRAZODONE HCL 50 MG PO TABS: 50.0000 mg | ORAL_TABLET | Freq: Every day | ORAL | 0 refills | Status: DC

## 2019-01-02 MED ORDER — PREDNISONE 20 MG PO TABS: ORAL_TABLET | ORAL | 0 refills | Status: DC

## 2019-01-02 MED ORDER — GUAIFENESIN-DM 100-10 MG/5ML PO SYRP: 10.0000 mL | ORAL_SOLUTION | ORAL | 0 refills | Status: DC | PRN

## 2019-01-02 MED ORDER — SENNA 8.6 MG PO TABS: 1.0000 | ORAL_TABLET | Freq: Two times a day (BID) | ORAL | 0 refills | Status: DC

## 2019-01-02 MED ORDER — METOPROLOL TARTRATE 25 MG PO TABS: 25.0000 mg | ORAL_TABLET | Freq: Two times a day (BID) | ORAL | Status: DC

## 2019-01-02 MED ORDER — ASCORBIC ACID 500 MG PO TABS: 500.0000 mg | ORAL_TABLET | Freq: Every day | ORAL | 0 refills | Status: DC

## 2019-01-02 MED ORDER — FUROSEMIDE 20 MG PO TABS: 20.0000 mg | ORAL_TABLET | Freq: Every day | ORAL | Status: AC

## 2019-01-02 NOTE — TOC Progression Note (Signed)
Transition of Care [facility]) - Progression Note    Patient Details  Name: Emmit Oriley MRN: [id number] Date of Birth: [birth year]  Transition of [facility]) CM/SW Contact  Ninfa Meeker, RN Phone Number:  [phone] (working remotely) 01/02/2019, 4:34 PM  Clinical Narrative:   78 yr old gentleman admitted for [place]. Patient is confused to events. Home Health was initially established for patient with [facility] in [place], [place]. Case manager is actively reachoing out to patient's sister, [name] (see previous notes). Will continue to monitor.    Expected Discharge Plan: [place] Barriers to Discharge: Continued Medical Work up  Expected Discharge Plan and Services Expected Discharge Plan: [place]   Discharge Planning Services: CM Consult Post Acute Care Choice: [place] arrangements for the past 2 months: Single Family Home Expected Discharge Date: 01/02/19               DME Arranged: N/A         HH Arranged: PT, OT HH Agency: [place] ([place], [place]) Date [place]: 12/30/18 Time [place]: 1222 Representative spoke with at [place]: [place] ([place]) Interventions    Readmission Risk Interventions No flowsheet data found.

## 2019-01-02 NOTE — NC FL2 (Signed)
[place] LEVEL OF CARE SCREENING TOOL     IDENTIFICATION  Patient Name: Billy Byrd Birthdate: 1940-07-19 Sex: male Admission Date (Current Location): 12/27/2018  [facility] [place] Number:  Herbalist and Address:         Provider Number: [id number]  Attending Physician Name and Address:  Bonnielee Haff, MD  Relative Name and Phone Number:       Current Level of Care: Hospital Recommended Level of Care: [place] Prior Approval Number:    Date Approved/Denied:   PASRR Number: [id number]  Discharge Plan: SNF    Current Diagnoses: Patient Active Problem List   Diagnosis Date Noted  . Acute respiratory disease due to COVID-19 virus 12/27/2018  . Acute respiratory failure with hypoxia ([place]) 12/27/2018  . Acute metabolic encephalopathy 96/78/9381  . Alcohol abuse 12/27/2018  . CAD (coronary artery disease) 12/27/2018  . Hypothyroidism 12/27/2018  . Osteoarthritis of right hip 01/14/2014    Orientation RESPIRATION BLADDER Height & Weight     Self(confused to events, situation and events)  Normal Continent Weight: 88.6 kg Height:  6\' 5"  (195.6 cm)  BEHAVIORAL SYMPTOMS/MOOD NEUROLOGICAL BOWEL NUTRITION STATUS  (none)   Continent    AMBULATORY STATUS COMMUNICATION OF NEEDS Skin   Extensive Assist Verbally Normal                       Personal Care Assistance Level of Assistance  Total care, Dressing, Bathing Bathing Assistance: Limited assistance   Dressing Assistance: Limited assistance Total Care Assistance: Maximum assistance   Functional Limitations Info  Sight(wears glasses) Sight Info: Impaired(wears glasses)        SPECIAL CARE FACTORS FREQUENCY  OT (By licensed OT), PT (By licensed PT)     PT Frequency: 5x/weekly OT Frequency: Min 3x/weekly            Contractures Contractures Info: Not present    Additional Factors Info  Code Status Code Status Info: Full Code             Current  Medications (01/02/2019):  This is the current hospital active medication list Current Facility-Administered Medications  Medication Dose Route Frequency Provider Last Rate Last Dose  . 0.9 %  sodium chloride infusion  250 mL Intravenous PRN Bonnielee Haff, MD   Stopped at 12/28/18 1858  . acetaminophen (TYLENOL) tablet 650 mg  650 mg Oral Q6H PRN Bonnielee Haff, MD   650 mg at 12/31/18 2110  . aspirin EC tablet 81 mg  81 mg Oral Daily Bonnielee Haff, MD   81 mg at 01/02/19 0854  . atorvastatin (LIPITOR) tablet 10 mg  10 mg Oral Daily Bonnielee Haff, MD   10 mg at 01/02/19 0854  . clopidogrel (PLAVIX) tablet 75 mg  75 mg Oral Daily Bonnielee Haff, MD   75 mg at 01/02/19 0853  . enoxaparin (LOVENOX) injection 40 mg  40 mg Subcutaneous BID Bonnielee Haff, MD   40 mg at 01/02/19 0856  . folic acid (FOLVITE) tablet 1 mg  1 mg Oral Daily Bonnielee Haff, MD   1 mg at 01/02/19 0853  . guaiFENesin-dextromethorphan (ROBITUSSIN DM) 100-10 MG/5ML syrup 10 mL  10 mL Oral Q4H PRN Bonnielee Haff, MD   10 mL at 12/28/18 0847  . Ipratropium-Albuterol (COMBIVENT) respimat 1 puff  1 puff Inhalation Q6H Bonnielee Haff, MD   1 puff at 01/02/19 1420  . levothyroxine (SYNTHROID) tablet 150 mcg  150 mcg Oral Daily Bonnielee Haff, MD  150 mcg at 01/02/19 0258  . metoprolol tartrate (LOPRESSOR) tablet 25 mg  25 mg Oral BID Bonnielee Haff, MD   25 mg at 01/02/19 0854  . multivitamin with minerals tablet 1 tablet  1 tablet Oral Daily Bonnielee Haff, MD   1 tablet at 01/02/19 [phone]  . nicotine (NICODERM CQ - dosed in mg/24 hours) patch 14 mg  14 mg Transdermal Daily Bonnielee Haff, MD   14 mg at 01/02/19 0855  . pantoprazole (PROTONIX) EC tablet 40 mg  40 mg Oral Daily Bonnielee Haff, MD   40 mg at 01/02/19 0911  . polyethylene glycol (MIRALAX / GLYCOLAX) packet 17 g  17 g Oral Daily PRN Bonnielee Haff, MD      . predniSONE (DELTASONE) tablet 40 mg  40 mg Oral Q breakfast Bonnielee Haff, MD   40 mg at 01/02/19  0852  . senna (SENOKOT) tablet 8.6 mg  1 tablet Oral BID Bonnielee Haff, MD   8.6 mg at 01/02/19 0912  . sodium chloride flush (NS) 0.9 % injection 3 mL  3 mL Intravenous Q12H Bonnielee Haff, MD   3 mL at 01/02/19 0858  . sodium chloride flush (NS) 0.9 % injection 3 mL  3 mL Intravenous PRN Bonnielee Haff, MD      . thiamine (VITAMIN B-1) tablet 100 mg  100 mg Oral Daily Bonnielee Haff, MD   100 mg at 01/02/19 0912  . traZODone (DESYREL) tablet 50 mg  50 mg Oral QHS Bonnielee Haff, MD   50 mg at 01/01/19 2115  . vitamin B-12 (CYANOCOBALAMIN) tablet 500 mcg  500 mcg Oral Daily Bonnielee Haff, MD   500 mcg at 01/02/19 [phone]  . vitamin C (ASCORBIC ACID) tablet 500 mg  500 mg Oral Daily Bonnielee Haff, MD   500 mg at 01/02/19 0853  . zinc sulfate capsule 220 mg  220 mg Oral Daily Bonnielee Haff, MD   220 mg at 01/02/19 0911     Discharge Medications: Please see discharge summary for a list of discharge medications.  Relevant Imaging Results:  Relevant Lab Results:   Additional Information SS# [id number]  Ninfa Meeker, RN

## 2019-01-02 NOTE — Care Management Important Message (Signed)
Important Message  Patient Details  Name: Billy Byrd MRN: [id number] Date of Birth: [birth year]   Medicare Important Message Given:  Yes - Important Message mailed due to current National Emergency    Verbal consent obtained due to current National Emergency  Relationship to patient: Brother/Sister Contact Name: Rory Xiang Call Date: 01/02/19  Time: 1458 Phone: [phone] Outcome: Spoke with contact Important Message mailed to: Other (must enter comment)(email/ [email or web address])     Lorelee Mclaurin 01/02/2019, 3:00 PM

## 2019-01-02 NOTE — Care Management (Addendum)
Case manager has left message for patient's sister- [name] on her [place] and on her home [phone]. Will follow up with her concerning SNF placement for her brother.      Ricki Miller, RN BSN Case Manager [phone]

## 2019-01-02 NOTE — Care Management Important Message (Signed)
Important Message  Patient Details  Name: Billy Byrd MRN: [id number] Date of Birth: Oct 01, 1940   Medicare Important Message Given:  Yes - Important Message mailed due to current National Emergency    Verbal consent obtained due to current National Emergency  Relationship to patient: Brother/Sister Contact Name: Gorge Almanza Call Date: 01/02/19  Time: 1453 Phone: [phone] Outcome: Spoke with contact Important Message mailed to: (email to / [email or web address])     Orbie Pyo 01/02/2019, 2:55 PM

## 2019-01-02 NOTE — Discharge Instructions (Signed)
COVID-19 Frequently Asked Questions °COVID-19 (coronavirus disease) is an infection that is caused by a large family of viruses. Some viruses cause illness in people and others cause illness in animals like camels, cats, and bats. In some cases, the viruses that cause illness in animals can spread to humans. °Where did the coronavirus come from? °In December 2019, [place] told the World Health Organization (WHO) of several cases of lung disease (human respiratory illness). These cases were linked to an open seafood and livestock market in the city of [place]. The link to the seafood and livestock market suggests that the virus may have spread from animals to humans. However, since that first outbreak in December, the virus has also been shown to spread from person to person. °What is the name of the disease and the virus? °Disease name °Early on, this disease was called novel coronavirus. This is because scientists determined that the disease was caused by a new (novel) respiratory virus. The World Health Organization (WHO) has now named the disease COVID-19, or coronavirus disease. °Virus name °The virus that causes the disease is called severe acute respiratory syndrome coronavirus 2 (SARS-CoV-2). °More information on disease and virus naming °World Health Organization (WHO): www.who.int/emergencies/diseases/novel-coronavirus-2019/technical-guidance/naming-the-coronavirus-disease-(covid-2019)-and-the-virus-that-causes-it °Who is at risk for complications from coronavirus disease? °Some people may be at higher risk for complications from coronavirus disease. This includes older adults and people who have chronic diseases, such as heart disease, diabetes, and lung disease. °If you are at higher risk for complications, take these extra precautions: °· Avoid close contact with people who are sick or have a fever or cough. Stay at least 3-6 ft (1-2 m) away from them, if possible. °· Wash your hands often with soap and  water for at least 20 seconds. °· Avoid touching your face, mouth, nose, or eyes. °· Keep supplies on hand at home, such as food, medicine, and cleaning supplies. °· Stay home as much as possible. °· Avoid social gatherings and travel. °How does coronavirus disease spread? °The virus that causes coronavirus disease spreads easily from person to person (is contagious). There are also cases of community-spread disease. This means the disease has spread to: °· People who have no known contact with other infected people. °· People who have not traveled to areas where there are known cases. °It appears to spread from one person to another through droplets from coughing or sneezing. °Can I get the virus from touching surfaces or objects? °There is still a lot that we do not know about the virus that causes coronavirus disease. Scientists are basing a lot of information on what they know about similar viruses, such as: °· Viruses cannot generally survive on surfaces for long. They need a human body (host) to survive. °· It is more likely that the virus is spread by close contact with people who are sick (direct contact), such as through: °? Shaking hands or hugging. °? Breathing in respiratory droplets that travel through the air. This can happen when an infected person coughs or sneezes on or near other people. °· It is less likely that the virus is spread when a person touches a surface or object that has the virus on it (indirect contact). The virus may be able to enter the body if the person touches a surface or object and then touches his or her face, eyes, nose, or mouth. °Can a person spread the virus without having symptoms of the disease? °It may be possible for the virus to spread before a person   has symptoms of the disease, but this is most likely not the main way the virus is spreading. It is more likely for the virus to spread by being in close contact with people who are sick and breathing in the respiratory  droplets of a sick person's cough or sneeze. °What are the symptoms of coronavirus disease? °Symptoms vary from person to person and can range from mild to severe. Symptoms may include: °· Fever. °· Cough. °· Tiredness, weakness, or fatigue. °· Fast breathing or feeling short of breath. °These symptoms can appear anywhere from 2 to 14 days after you have been exposed to the virus. If you develop symptoms, call your health care provider. People with severe symptoms may need hospital care. °If I am exposed to the virus, how long does it take before symptoms start? °Symptoms of coronavirus disease may appear anywhere from 2 to 14 days after a person has been exposed to the virus. If you develop symptoms, call your health care provider. °Should I be tested for this virus? °Your health care provider will decide whether to test you based on your symptoms, history of exposure, and your risk factors. °How does a health care provider test for this virus? °Health care providers will collect samples to send for testing. Samples may include: °· Taking a swab of fluid from the nose. °· Taking fluid from the lungs by having you cough up mucus (sputum) into a sterile cup. °· Taking a blood sample. °· Taking a stool or urine sample. °Is there a treatment or vaccine for this virus? °Currently, there is no vaccine to prevent coronavirus disease. Also, there are no medicines like antibiotics or antivirals to treat the virus. A person who becomes sick is given supportive care, which means rest and fluids. A person may also relieve his or her symptoms by using over-the-counter medicines that treat sneezing, coughing, and runny nose. These are the same medicines that a person takes for the common cold. °If you develop symptoms, call your health care provider. People with severe symptoms may need hospital care. °What can I do to protect myself and my family from this virus? ° °  ° °You can protect yourself and your family by taking the  same actions that you would take to prevent the spread of other viruses. Take the following actions: °· Wash your hands often with soap and water for at least 20 seconds. If soap and water are not available, use alcohol-based hand sanitizer. °· Avoid touching your face, mouth, nose, or eyes. °· Cough or sneeze into a tissue, sleeve, or elbow. Do not cough or sneeze into your hand or the air. °? If you cough or sneeze into a tissue, throw it away immediately and wash your hands. °· Disinfect objects and surfaces that you frequently touch every day. °· Avoid close contact with people who are sick or have a fever or cough. Stay at least 3-6 ft (1-2 m) away from them, if possible. °· Stay home if you are sick, except to get medical care. Call your health care provider before you get medical care. °· Make sure your vaccines are up to date. Ask your health care provider what vaccines you need. °What should I do if I need to travel? °Follow travel recommendations from your local health authority, the CDC, and WHO. °Travel information and advice °· Centers for Disease Control and Prevention (CDC): www.cdc.gov/coronavirus/2019-ncov/travelers/index.html °· World Health Organization (WHO): www.who.int/emergencies/diseases/novel-coronavirus-2019/travel-advice °Know the risks and take action to protect your health °·   You are at higher risk of getting coronavirus disease if you are traveling to areas with an outbreak or if you are exposed to travelers from areas with an outbreak. °· Wash your hands often and practice good hygiene to lower the risk of catching or spreading the virus. °What should I do if I am sick? °General instructions to stop the spread of infection °· Wash your hands often with soap and water for at least 20 seconds. If soap and water are not available, use alcohol-based hand sanitizer. °· Cough or sneeze into a tissue, sleeve, or elbow. Do not cough or sneeze into your hand or the air. °· If you cough or  sneeze into a tissue, throw it away immediately and wash your hands. °· Stay home unless you must get medical care. Call your health care provider or local health authority before you get medical care. °· Avoid public areas. Do not take public transportation, if possible. °· If you can, wear a mask if you must go out of the house or if you are in close contact with someone who is not sick. °Keep your home clean °· Disinfect objects and surfaces that are frequently touched every day. This may include: °? Counters and tables. °? Doorknobs and light switches. °? Sinks and faucets. °? Electronics such as phones, remote controls, keyboards, computers, and tablets. °· Wash dishes in hot, soapy water or use a dishwasher. Air-dry your dishes. °· Wash laundry in hot water. °Prevent infecting other household members °· Let healthy household members care for children and pets, if possible. If you have to care for children or pets, wash your hands often and wear a mask. °· Sleep in a different bedroom or bed, if possible. °· Do not share personal items, such as razors, toothbrushes, deodorant, combs, brushes, towels, and washcloths. °Where to find more information °Centers for Disease Control and Prevention (CDC) °· Information and news updates: www.cdc.gov/coronavirus/2019-ncov °World Health Organization (WHO) °· Information and news updates: www.who.int/emergencies/diseases/novel-coronavirus-2019 °· Coronavirus health topic: www.who.int/health-topics/coronavirus °· Questions and answers on COVID-19: www.who.int/news-room/q-a-detail/q-a-coronaviruses °· Global tracker: who.sprinklr.com °American Academy of Pediatrics (AAP) °· Information for families: www.healthychildren.org/English/health-issues/conditions/chest-lungs/Pages/2019-Novel-Coronavirus.aspx °The coronavirus situation is changing rapidly. Check your local health authority website or the CDC and WHO websites for updates and news. °When should I contact a health care  provider? °· Contact your health care provider if you have symptoms of an infection, such as fever or cough, and you: °? Have been near anyone who is known to have coronavirus disease. °? Have come into contact with a person who is suspected to have coronavirus disease. °? Have traveled outside of the country. °When should I get emergency medical care? °· Get help right away by calling your local emergency services (911 in the [place] if you have: °? Trouble breathing. °? Pain or pressure in your chest. °? Confusion. °? Blue-tinged lips and fingernails. °? Difficulty waking from sleep. °? Symptoms that get worse. °Let the emergency medical personnel know if you think you have coronavirus disease. °Summary °· A new respiratory virus is spreading from person to person and causing COVID-19 (coronavirus disease). °· The virus that causes COVID-19 appears to spread easily. It spreads from one person to another through droplets from coughing or sneezing. °· Older adults and those with chronic diseases are at higher risk of disease. If you are at higher risk for complications, take extra precautions. °· There is currently no vaccine to prevent coronavirus disease. There are no medicines, such as antibiotics or   antivirals, to treat the virus. °· You can protect yourself and your family by washing your hands often, avoiding touching your face, and covering your coughs and sneezes. °This information is not intended to replace advice given to you by your health care provider. Make sure you discuss any questions you have with your health care provider. °Document Released: 09/05/2018 Document Revised: 09/05/2018 Document Reviewed: 09/05/2018 °[employer] Patient Education © 2020 [employer] ° °

## 2019-01-02 NOTE — Discharge Summary (Signed)
[facility]  Physician Discharge Summary   Patient ID: Billy Byrd MRN: [id number] DOB/AGE: [birth year] 78 y.o.  Admit date: 12/27/2018 Discharge date: 01/02/2019  PCP: Orpah Cobb., MD  DISCHARGE DIAGNOSES:  Acute respiratory failure with hypoxia, resolved Pneumonia due to [id number] Acute metabolic encephalopathy, improved Nonsustained ventricular tachycardia, resolved History of coronary artery disease Macrocytic anemia History of hypothyroidism 3 of vitamin B12 deficiency History of alcoholism   RECOMMENDATIONS FOR OUTPATIENT FOLLOW UP: 1. CBC and basic metabolic panel in 1 week 2. He will need follow-up with his cardiologist in the next 4 to 6 weeks. 3. Check thyroid function test in 4 to 6 weeks.    Home Health: Going to skilled nursing facility Equipment/Devices: None  CODE STATUS: Full code  DISCHARGE CONDITION: fair  Diet recommendation: Heart healthy  INITIAL HISTORY: 78 y.o.malewith a past medical history of coronary artery disease, status post PCI more than 2 years ago, on aspirin and Plavix, history of hypothyroidism, history of alcohol abuse, history of tobacco abuse who has been living with a sister for the past 1 month. It is not clear why he moved in with hissister but apparently has been having frequent falls and has not been able to take care of himself,presumably due to his issues with alcoholism. Patient apparently goes to a"club" where he consumes alcohol. It is thought that that iswhere he contracted COVID-19. He has been sick for about 5 to 6 days. He was taken to the hospital in [facility] on Saturday and was hospitalized. He was started on antibacterial agents. Subsequently the COVID-19 test came back as being positive. His oxygenation has worsened over the last 24 hours. He was initially on 2 L of oxygen subsequently went to 6 L and then 10 L this morning. Chest x-ray was repeated this morning which showed  worsening bilateral infiltrates. He was subsequently transferred over to this facility for further management.    HOSPITAL COURSE:   Acute Hypoxic Resp. Failure due to Acute Covid 19 Viral Illness Patient was noted to be hypoxic.  Chest x-ray showed pneumonia.  Patient was noted to have elevated inflammatory markers.  Patient was started on Remdesivir and steroids.  Patient's respiratory status continued to improve.  He is now saturating in the early 90s on room air.  May need oxygen with ambulation.  Overall he has improved.  Seen by PT and OT.  Skilled nursing facility recommended for short-term rehab.  Acute metabolic encephalopathy Most likely due to combination of acute illness as well as his history of alcoholism.  Mental status has been improving. Seems to be close to baseline.  No neurological deficits.    NSVT Noted to have multiple episodes of nonsustained ventricular tachycardia on 8/6.  Patient remained asymptomatic.  EKG did not show any concerning findings. Potassium was supplemented.  Patient was started on beta-blocker.  No further episodes of nonsustained ventricular tachycardia in the last 3 days.  Apparently he is followed by a cardiologist in [place].  According to care everywhere he had a stress test in 2017 which showed normal systolic function at that time.  Patient will need outpatient follow-up with cardiology.  History of coronary artery disease He has had stent placement previously.  Followed by cardiologist in [place].  Continue aspirin, Plavix and statin.  Beta-blockers initiated as discussed above.  Remained stable from a cardiac standpoint.  Macrocytic anemia No evidence of overt bleeding. Anemia panel reviewed.  No clear deficiencies identified.  Continue vitamin B12.  Hemoglobin  is stable  History of hypothyroidism Continue levothyroxine.  TSH is low most likely due to sick euthyroid.  Free T4 is normal at 0.96.  History of vitamin B12  deficiency Continue vitamin B12.  History of alcohol abuse with alcohol withdrawal syndrome at the outside hospital No evidence for withdrawal at this time.    Continue multivitamins.  Alcohol cessation emphasized.   Overall stable.  Okay for discharge to skilled nursing facility when bed is available.      PERTINENT LABS:  The results of significant diagnostics from this hospitalization (including imaging, microbiology, ancillary and laboratory) are listed below for reference.      Labs: Basic Metabolic Panel: Recent Labs  Lab 12/28/18 1648 12/29/18 0305 12/30/18 0244 12/31/18 0223 01/01/19 0252  NA 138 139 136 139 135  K 3.8 4.1 4.0 4.1 4.0  CL 98 102 100 102 98  CO2 28 28 27 28 27   GLUCOSE 123* 120* 122* 90 80  BUN 22 21 24* 28* 26*  CREATININE 1.03 0.96 0.97 0.93 0.96  CALCIUM 9.0 8.9 8.8* 8.8* 8.7*  MG 2.0 2.0 2.1 2.2 2.1   Liver Function Tests: Recent Labs  Lab 12/28/18 0235 12/29/18 0305 12/30/18 0244 12/31/18 0223 01/01/19 0252  AST 39 29 23 28 24   ALT 28 29 27  32 29  ALKPHOS 59 56 52 52 60  BILITOT 0.5 0.5 0.8 0.5 0.6  PROT 6.2* 6.1* 5.9* 5.8* 5.9*  ALBUMIN 2.9* 2.9* 2.7* 2.7* 2.8*   CBC: Recent Labs  Lab 12/28/18 0235 12/29/18 0305 12/30/18 0244 12/31/18 0223  WBC 11.1* 11.1* 11.8* 11.1*  NEUTROABS 9.8* 9.7* 10.4* 9.2*  HGB 12.5* 12.8* 12.4* 12.9*  HCT 37.4* 37.8* 37.8* 38.6*  MCV 100.3* 98.7 100.3* 99.7  PLT 217 238 301 308     IMAGING STUDIES Dg Chest Port 1 View  Result Date: 12/28/2018 CLINICAL DATA:  COVID-19 positivity. EXAM: PORTABLE CHEST 1 VIEW COMPARISON:  Yesterday FINDINGS: Stable low volume chest with bilateral infiltrate. No effusion or pneumothorax. Stable heart size and mediastinal contours. IMPRESSION: Low volume chest with bilateral pneumonia that is stable from yesterday. Electronically Signed   By: Monte Fantasia M.D.   On: 12/28/2018 08:57    DISCHARGE EXAMINATION: Vitals:   01/01/19 1936 01/01/19 2349  01/02/19 0422 01/02/19 0753  BP: 121/75 125/82  123/81  Pulse: 80 62  80  Resp: 13 13  12   Temp: 98 F (36.7 C) 98.5 F (36.9 C) 98.5 F (36.9 C) 98.1 F (36.7 C)  TempSrc: Oral Oral Oral Oral  SpO2: 92% 94%  99%  Weight:   88.6 kg   Height:       General appearance: Awake alert.  In no distress Resp: Clear to auscultation bilaterally.  Normal effort Cardio: S1-S2 is normal regular.  No S3-S4.  No rubs murmurs or bruit GI: Abdomen is soft.  Nontender nondistended.  Bowel sounds are present normal.  No masses organomegaly Extremities: No edema.  Full range of motion of lower extremities. Neurologic:  No focal neurological deficits.    DISPOSITION: SNF  Discharge Instructions    Call MD for:  difficulty breathing, headache or visual disturbances   Complete by: As directed    Call MD for:  extreme fatigue   Complete by: As directed    Call MD for:  persistant dizziness or light-headedness   Complete by: As directed    Call MD for:  persistant nausea and vomiting   Complete by: As directed  Call MD for:  temperature >100.4   Complete by: As directed    Diet - low sodium heart healthy   Complete by: As directed    Discharge instructions   Complete by: As directed    Please review instructions on the discharge summary.  COVID 19 INSTRUCTIONS  - You are felt to be stable enough to no longer require inpatient monitoring, testing, and treatment, though you will need to follow the recommendations below: - Based on the CDC's non-test criteria for ending self-isolation: You may not return to work/leave the home until at least 21 days since symptom onset AND 3 days without a fever (without taking tylenol, ibuprofen, etc.) AND have improvement in respiratory symptoms. - Do not take NSAID medications (including, but not limited to, ibuprofen, advil, motrin, naproxen, aleve, goody's powder, etc.) - Follow up with your doctor in the next week via telehealth or seek medical attention  right away if your symptoms get WORSE.  - Consider donating plasma after you have recovered (either 14 days after a negative test or 28 days after symptoms have completely resolved) because your antibodies to this virus may be helpful to give to others with life-threatening infections. Please go to the website www.[email or web address] if you would like to consider volunteering for plasma donation.    Directions for you at home:  Wear a facemask You should wear a facemask that covers your nose and mouth when you are in the same room with other people and when you visit a healthcare provider. People who live with or visit you should also wear a facemask while they are in the same room with you.  Separate yourself from other people in your home As much as possible, you should stay in a different room from other people in your home. Also, you should use a separate bathroom, if available.  Avoid sharing household items You should not share dishes, drinking glasses, cups, eating utensils, towels, bedding, or other items with other people in your home. After using these items, you should wash them thoroughly with soap and water.  Cover your coughs and sneezes Cover your mouth and nose with a tissue when you cough or sneeze, or you can cough or sneeze into your sleeve. Throw used tissues in a lined trash can, and immediately wash your hands with soap and water for at least 20 seconds or use an alcohol-based hand rub.  Wash your [facility] your hands often and thoroughly with soap and water for at least 20 seconds. You can use an alcohol-based hand sanitizer if soap and water are not available and if your hands are not visibly dirty. Avoid touching your eyes, nose, and mouth with unwashed hands.  Directions for those who live with, or provide care at home for you:  Limit the number of people who have contact with the patient If possible, have only one caregiver for the patient. Other household members  should stay in another home or place of residence. If this is not possible, they should stay in another room, or be separated from the patient as much as possible. Use a separate bathroom, if available. Restrict visitors who do not have an essential need to be in the home.  Ensure good ventilation Make sure that shared spaces in the home have good air flow, such as from an air conditioner or an opened window, weather permitting.  Wash your hands often Wash your hands often and thoroughly with soap and water for at least 20 seconds.  You can use an alcohol based hand sanitizer if soap and water are not available and if your hands are not visibly dirty. Avoid touching your eyes, nose, and mouth with unwashed hands. Use disposable paper towels to dry your hands. If not available, use dedicated cloth towels and replace them when they become wet.  Wear a facemask and gloves Wear a disposable facemask at all times in the room and gloves when you touch or have contact with the patients blood, body fluids, and/or secretions or excretions, such as sweat, saliva, sputum, nasal mucus, vomit, urine, or feces.  Ensure the mask fits over your nose and mouth tightly, and do not touch it during use. Throw out disposable facemasks and gloves after using them. Do not reuse. Wash your hands immediately after removing your facemask and gloves. If your personal clothing becomes contaminated, carefully remove clothing and launder. Wash your hands after handling contaminated clothing. Place all used disposable facemasks, gloves, and other waste in a lined container before disposing them with other household waste. Remove gloves and wash your hands immediately after handling these items.  Do not share dishes, glasses, or other household items with the patient Avoid sharing household items. You should not share dishes, drinking glasses, cups, eating utensils, towels, bedding, or other items with a patient who is  confirmed to have, or being evaluated for, COVID-19 infection. After the person uses these items, you should wash them thoroughly with soap and water.  Wash laundry thoroughly Immediately remove and wash clothes or bedding that have blood, body fluids, and/or secretions or excretions, such as sweat, saliva, sputum, nasal mucus, vomit, urine, or feces, on them. Wear gloves when handling laundry from the patient. Read and follow directions on labels of laundry or clothing items and detergent. In general, wash and dry with the warmest temperatures recommended on the label.  Clean all areas the individual has used often Clean all touchable surfaces, such as counters, tabletops, doorknobs, bathroom fixtures, toilets, phones, keyboards, tablets, and bedside tables, every day. Also, clean any surfaces that may have blood, body fluids, and/or secretions or excretions on them. Wear gloves when cleaning surfaces the patient has come in contact with. Use a diluted bleach solution (e.g., dilute bleach with 1 part bleach and 10 parts water) or a household disinfectant with a label that says EPA-registered for coronaviruses. To make a bleach solution at home, add 1 tablespoon of bleach to 1 quart (4 cups) of water. For a larger supply, add  cup of bleach to 1 gallon (16 cups) of water. Read labels of cleaning products and follow recommendations provided on product labels. Labels contain instructions for safe and effective use of the cleaning product including precautions you should take when applying the product, such as wearing gloves or eye protection and making sure you have good ventilation during use of the product. Remove gloves and wash hands immediately after cleaning.  Monitor yourself for signs and symptoms of illness Caregivers and household members are considered close contacts, should monitor their health, and will be asked to limit movement outside of the home to the extent possible. Follow the  monitoring steps for close contacts listed on the symptom monitoring form.   If you have additional questions, contact your local health department or call the epidemiologist on call at [phone] (available 24/7). This guidance is subject to change. For the most up-to-date guidance from [facility], please refer to their website: YouBlogs.pl   You were cared for by a hospitalist during your  hospital stay. If you have any questions about your discharge medications or the care you received while you were in the hospital after you are discharged, you can call the unit and asked to speak with the hospitalist on call if the hospitalist that took care of you is not available. Once you are discharged, your primary care physician will handle any further medical issues. Please note that NO REFILLS for any discharge medications will be authorized once you are discharged, as it is imperative that you return to your primary care physician (or establish a relationship with a primary care physician if you do not have one) for your aftercare needs so that they can reassess your need for medications and monitor your lab values. If you do not have a primary care physician, you can call [phone] for a physician referral.   Increase activity slowly   Complete by: As directed         Allergies as of 01/02/2019   No Known Allergies     Medication List    STOP taking these medications   clonazePAM 1 MG tablet Commonly known as: KLONOPIN   zolpidem 10 MG tablet Commonly known as: AMBIEN     TAKE these medications   acetaminophen 500 MG tablet Commonly known as: TYLENOL Take 1 tablet by mouth every 6 (six) hours as needed for pain.   ascorbic acid 500 MG tablet Commonly known as: VITAMIN C Take 1 tablet (500 mg total) by mouth daily for 7 days. Start taking on: January 03, 2019   atorvastatin 10 MG tablet Commonly known as: LIPITOR Take 10 mg  by mouth daily.   clopidogrel 75 MG tablet Commonly known as: PLAVIX Take 75 mg by mouth daily.   cyanocobalamin 1000 MCG/ML injection Commonly known as: (VITAMIN B-12) Inject 1,000 mcg into the muscle every 30 (thirty) days. First of each month   furosemide 20 MG tablet Commonly known as: LASIX Take 1 tablet (20 mg total) by mouth daily. What changed:   medication strength  how much to take   guaiFENesin-dextromethorphan 100-10 MG/5ML syrup Commonly known as: ROBITUSSIN DM Take 10 mLs by mouth every 4 (four) hours as needed for cough.   levothyroxine 150 MCG tablet Commonly known as: SYNTHROID Take 150 mcg by mouth every other day. Take 150 mcg every other day, alternating with 175 mcg   metoprolol tartrate 25 MG tablet Commonly known as: LOPRESSOR Take 1 tablet (25 mg total) by mouth 2 (two) times daily.   multivitamin with minerals Tabs tablet Take 1 tablet by mouth daily. Start taking on: January 03, 2019   nicotine 14 mg/24hr patch Commonly known as: NICODERM CQ - dosed in mg/24 hours Place 1 patch (14 mg total) onto the skin daily. Start taking on: January 03, 2019   nitroGLYCERIN 0.4 MG SL tablet Commonly known as: NITROSTAT Place 1 tablet under the tongue every 5 (five) minutes as needed.   polyethylene glycol 17 g packet Commonly known as: MIRALAX / GLYCOLAX Take 17 g by mouth daily as needed for mild constipation.   potassium chloride 10 MEQ tablet Commonly known as: K-DUR Take 1 tablet by mouth daily.   predniSONE 20 MG tablet Commonly known as: DELTASONE Take 2 tablets once daily for 3 days, then take 1 tablets once daily for 3 days, then STOP.   senna 8.6 MG Tabs tablet Commonly known as: SENOKOT Take 1 tablet (8.6 mg total) by mouth 2 (two) times daily.   traZODone 50 MG tablet  Commonly known as: DESYREL Take 1 tablet (50 mg total) by mouth at bedtime.   ZEGERID OTC PO Take 1 tablet by mouth daily.   zinc sulfate 220 (50 Zn) MG  capsule Take 1 capsule (220 mg total) by mouth daily for 7 days.        Follow-up Information    Orpah Cobb., MD. Schedule an appointment as soon as possible for a visit in 1 week(s).   Specialty: Internal Medicine Contact information: [address] [place] [address] [phone]           TOTAL DISCHARGE TIME: 90 minutes  [place]  [facility] Pager on [email or web address]  01/02/2019, 10:37 AM

## 2019-01-03 NOTE — Plan of Care (Signed)
  Problem: Education: Goal: Knowledge of General Education information will improve Description: Including pain rating scale, medication(s)/side effects and non-pharmacologic comfort measures Outcome: Progressing   Problem: Health Behavior/Discharge Planning: Goal: Ability to manage health-related needs will improve Outcome: Progressing   Problem: Clinical Measurements: Goal: Ability to maintain clinical measurements within normal limits will improve Outcome: Progressing Goal: Will remain free from infection Outcome: Progressing Goal: Diagnostic test results will improve Outcome: Progressing Goal: Respiratory complications will improve Outcome: Progressing   Problem: Activity: Goal: Risk for activity intolerance will decrease Outcome: Progressing   Problem: Pain Managment: Goal: General experience of comfort will improve Outcome: Progressing   Problem: Safety: Goal: Ability to remain free from injury will improve Outcome: Progressing   Problem: Clinical Measurements: Goal: Cardiovascular complication will be avoided Outcome: Completed/Met   Problem: Nutrition: Goal: Adequate nutrition will be maintained Outcome: Completed/Met   Problem: Coping: Goal: Level of anxiety will decrease Outcome: Completed/Met   Problem: Elimination: Goal: Will not experience complications related to bowel motility Outcome: Completed/Met Goal: Will not experience complications related to urinary retention Outcome: Completed/Met

## 2019-01-03 NOTE — Care Management (Signed)
Patient's sister, [name] has not returned call to CM. Multiple voive message have been left on cell and Home Phone. CM will try again tomorrow. Need her to give bed choices for SNF.     Ricki Miller, RN BSN Case Manager [phone]

## 2019-01-03 NOTE — Progress Notes (Signed)
PROGRESS NOTE  Billy Byrd ZCH:[id number] DOB: Mar 13, 1941 DOA: 12/27/2018  PCP: Orpah Cobb., MD  Brief History/Interval Summary: 78 y.o. male with a past medical history of coronary artery disease, status post PCI more than 2 years ago, on aspirin and Plavix, history of hypothyroidism, history of alcohol abuse, history of tobacco abuse who has been living with a sister for the past 1 month.  It is not clear why he moved in with his sister but apparently has been having frequent falls and has not been able to take care of himself, presumably due to his issues with alcoholism.  Patient apparently goes to a "club" where he consumes alcohol.  It is thought that that is where he contracted COVID-19.  He has been sick for about 5 to 6 days.  He was taken to the hospital in [facility] on Saturday and was hospitalized.  He was started on antibacterial agents.  Subsequently the COVID-19 test came back as being positive.  His oxygenation has worsened over the last 24 hours.  He was initially on 2 L of oxygen subsequently went to 6 L and then 10 L this morning.  Chest x-ray was repeated this morning which showed worsening bilateral infiltrates.  He was subsequently transferred over to this facility for further management.   Reason for Visit: Acute respiratory disease due to COVID-19  Consultants: None  Procedures: None  Antibiotics: He was on ceftriaxone and azithromycin at the outside facility.  Not continued.  Subjective/Interval History: Patient denies any complaints today, no significant events overnight   Assessment/Plan:  Acute Hypoxic Resp. Failure due to Acute Covid 19 Viral Illness  COVID-19 Labs  Recent Labs    01/01/19 0252  DDIMER 3.98*    Fever: Remains afebrile he is currently on room air Antibacterials: None Remdesivir: Completed course Steroids: Initially on Solu-Medrol.  Changed over to prednisone. Diuretics: Patient takes Lasix on a regular basis at  home.  Given intravenous dose on 8/6. Actemra: Not indicated Vitamin C and Zinc: Continue DVT Prophylaxis:  Lovenox 40 mg every 24 hours  Patient's respiratory status continues to improve.  He is currently on room air.  Completed course of Remdesivir , Appetite is improved.  Mentation also appears to be improving.  Continue incentive spirometry mobilization.     Acute metabolic encephalopathy Most likely due to combination of acute illness as well as his history of alcoholism.  Mental status has been improving.  Seems to be close to baseline.  No neurological deficits.    NSVT Noted to have multiple episodes of nonsustained ventricular tachycardia on 8/6.  Patient remained asymptomatic.  EKG did not show any concerning findings. Potassium was supplemented.  Patient was started on beta-blocker.  No further episodes of nonsustained ventricular tachycardia in the last 2 days.  Apparently he is followed by a cardiologist in [place].  According to care everywhere he had a stress test in 2017 which showed normal systolic function at that time.  Patient will need outpatient follow-up with cardiology.  History of coronary artery disease He has had stent placement previously.  Followed by cardiologist in [place].  Continue aspirin, Plavix and statin.  Beta-blockers initiated as discussed above.  Remained stable from a cardiac standpoint.  Macrocytic anemia No evidence of overt bleeding. Anemia panel reviewed.  No clear deficiencies identified.  Continue vitamin B12.  Hemoglobin is stable  History of hypothyroidism Continue levothyroxine.  TSH is low most likely due to sick euthyroid.  Free T4 is  normal at 0.96.  History of vitamin B12 deficiency Continue vitamin B12.  History of alcohol abuse with alcohol withdrawal syndrome at the outside hospital No evidence for withdrawal at this time.  Continue thiamine multivitamins folic acid.  Has been on CIWA protocol.  DVT Prophylaxis: Lovenox PUD  Prophylaxis: Protonix Code Status: Full code Family Communication: Discussed with patient.  Disposition Plan: SNF     Medications:  Scheduled: . aspirin EC  81 mg Oral Daily  . atorvastatin  10 mg Oral Daily  . clopidogrel  75 mg Oral Daily  . enoxaparin (LOVENOX) injection  40 mg Subcutaneous BID  . folic acid  1 mg Oral Daily  . Ipratropium-Albuterol  1 puff Inhalation Q6H  . levothyroxine  150 mcg Oral Daily  . metoprolol tartrate  25 mg Oral BID  . multivitamin with minerals  1 tablet Oral Daily  . nicotine  14 mg Transdermal Daily  . pantoprazole  40 mg Oral Daily  . predniSONE  40 mg Oral Q breakfast  . senna  1 tablet Oral BID  . sodium chloride flush  3 mL Intravenous Q12H  . thiamine  100 mg Oral Daily  . traZODone  50 mg Oral QHS  . vitamin B-12  500 mcg Oral Daily  . vitamin C  500 mg Oral Daily  . zinc sulfate  220 mg Oral Daily   Continuous: . sodium chloride Stopped (12/28/18 1858)   TZG:YFVCBS chloride, acetaminophen, guaiFENesin-dextromethorphan, polyethylene glycol, sodium chloride flush   Objective:  Vital Signs  Vitals:   01/03/19 0310 01/03/19 0320 01/03/19 0330 01/03/19 0929  BP: 127/72   109/71  Pulse:  64 67 83  Resp: 15 11 12 16   Temp: 98.4 F (36.9 C)   98.1 F (36.7 C)  TempSrc: Oral   Oral  SpO2:  100% 97% 100%  Weight:      Height:        Intake/Output Summary (Last 24 hours) at 01/03/2019 1236 Last data filed at 01/03/2019 0930 Gross per 24 hour  Intake 600 ml  Output 200 ml  Net 400 ml   Filed Weights   12/27/18 1600 12/31/18 0500 01/02/19 0422  Weight: 93 kg 92.3 kg 88.6 kg    Awake Alert, pleasant, sitting in recliner in no apparent distress Symmetrical Chest wall movement, Good air movement bilaterally, CTAB RRR,No Gallops,Rubs or new Murmurs, No Parasternal Heave +ve B.Sounds, Abd Soft, No tenderness, No rebound - guarding or rigidity. No Cyanosis, Clubbing or edema, No new Rash or bruise       Lab Results:   Data Reviewed: I have personally reviewed following labs and imaging studies  CBC: Recent Labs  Lab 12/28/18 0235 12/29/18 0305 12/30/18 0244 12/31/18 0223  WBC 11.1* 11.1* 11.8* 11.1*  NEUTROABS 9.8* 9.7* 10.4* 9.2*  HGB 12.5* 12.8* 12.4* 12.9*  HCT 37.4* 37.8* 37.8* 38.6*  MCV 100.3* 98.7 100.3* 99.7  PLT 217 238 301 496    Basic Metabolic Panel: Recent Labs  Lab 12/28/18 1648 12/29/18 0305 12/30/18 0244 12/31/18 0223 01/01/19 0252  NA 138 139 136 139 135  K 3.8 4.1 4.0 4.1 4.0  CL 98 102 100 102 98  CO2 28 28 27 28 27   GLUCOSE 123* 120* 122* 90 80  BUN 22 21 24* 28* 26*  CREATININE 1.03 0.96 0.97 0.93 0.96  CALCIUM 9.0 8.9 8.8* 8.8* 8.7*  MG 2.0 2.0 2.1 2.2 2.1    GFR: Estimated Creatinine Clearance: 83.3 mL/min (by C-G formula based  on SCr of 0.96 mg/dL).  Liver Function Tests: Recent Labs  Lab 12/28/18 0235 12/29/18 0305 12/30/18 0244 12/31/18 0223 01/01/19 0252  AST 39 29 23 28 24   ALT 28 29 27  32 29  ALKPHOS 59 56 52 52 60  BILITOT 0.5 0.5 0.8 0.5 0.6  PROT 6.2* 6.1* 5.9* 5.8* 5.9*  ALBUMIN 2.9* 2.9* 2.7* 2.7* 2.8*     Anemia Panel: No results for input(s): VITAMINB12, FOLATE, FERRITIN, TIBC, IRON, RETICCTPCT in the last 72 hours.    Radiology Studies: No results found.     LOS: 7 days   Phillips Climes MD  [facility] Pager on [email or web address]  01/03/2019, 12:36 PM

## 2019-01-03 NOTE — Plan of Care (Signed)
Patient adequate for discharge. Awaiting SNF placement. He has had an uneventful day. Patient has sat up in the chair for all meals. He denies pain. Patient still has continuous tremors but denies any other issues. His brother Billy Byrd [facility]) was updated.

## 2019-01-04 LAB — SARS CORONAVIRUS 2 BY RT PCR (HOSPITAL ORDER, PERFORMED IN ~~LOC~~ HOSPITAL LAB): SARS Coronavirus 2: POSITIVE — AB

## 2019-01-04 NOTE — Progress Notes (Signed)
CRITICAL VALUE ALERT  Critical Value:  Covid Test still positive  Date & Time Notied:  01/04/2019 2024

## 2019-01-04 NOTE — Progress Notes (Signed)
Physical Therapy Treatment Patient Details Name: Mackson Botz MRN: [id number] DOB: [birth year] Today's Date: 01/04/2019    History of Present Illness Pt is a 78 y.o. male admitted 12/27/18 with SOB and worsening oxygenation; COVID-19 positive. CXR with worsening bilateral infiltrates. Pt also with acute metabolic encephalopathy. PMH includes CAD, ETOH/tobacco abuse, OSA.    PT Comments    Patient very pleasant and agrees to try tasks as requested by PT. Balance improved and cardiopulmonary status also improving. On room air, at rest sats 95%, while walking 97-98% however once seated drops to 90% with RR 22 and pt stating he feels short of Breath.     Follow Up Recommendations  SNF;Supervision/Assistance - 24 hour     Equipment Recommendations  None recommended by PT    Recommendations for Other Services       Precautions / Restrictions Precautions Precautions: Fall Precaution Comments: Tremulous Restrictions Weight Bearing Restrictions: No    Mobility  Bed Mobility                  Transfers Overall transfer level: Needs assistance Equipment used: None;1 person hand held assist Transfers: Sit to/from Stand Sit to Stand: Min guard         General transfer comment: No boost required from recliner; uses bil UEs to boost himself; reaching out for UE support once standing  Ambulation/Gait Ambulation/Gait assistance: Min assist Gait Distance (Feet): 50 Feet(x2) Assistive device: 1 person hand held assist Gait Pattern/deviations: Step-through pattern;Decreased stride length;Trunk flexed Gait velocity: decr   General Gait Details: RW had been removed from his room. Very light assist via HHA on his left; vc for upright posture and improved foot clearance   Stairs             Wheelchair Mobility    Modified Rankin (Stroke Patients Only)       Balance Overall balance assessment: Needs assistance Sitting-balance support: Feet supported Sitting  balance-Leahy Scale: Fair     Standing balance support: No upper extremity supported;During functional activity Standing balance-Leahy Scale: Poor Standing balance comment: seeks UE support in standing                            Cognition Arousal/Alertness: Awake/alert Behavior During Therapy: WFL for tasks assessed/performed Overall Cognitive Status: No family/caregiver present to determine baseline cognitive functioning Area of Impairment: Orientation;Memory;Following commands;Safety/judgement;Awareness;Problem solving                 Orientation Level: Disoriented to;Situation Current Attention Level: Sustained;Selective Memory: Decreased short-term memory Following Commands: Follows one step commands with increased time;Follows multi-step commands inconsistently Safety/Judgement: Decreased awareness of deficits;Decreased awareness of safety Awareness: Intellectual Problem Solving: Requires verbal cues;Slow processing General Comments: patient could not seem to understand that it was safe to walk in the hall bc he already has the coronavirus "everyone out there has it and I don't want to get it"      Exercises Other Exercises Other Exercises: seated: marching x 20; LAQ x 10 Other Exercises: standing holding bed rail of elevated bed: marching, squats x 10, rt hip abdct x 7 and requested seated rest; did not resume because wanted to eat his lunch    General Comments        Pertinent Vitals/Pain Pain Assessment: No/denies pain    Home Living                      Prior Function  PT Goals (current goals can now be found in the care plan section) Acute Rehab PT Goals Patient Stated Goal: Get my house built how I want it Time For Goal Achievement: 01/11/19 Potential to Achieve Goals: Good Progress towards PT goals: Progressing toward goals    Frequency    Min 2X/week      PT Plan Current plan remains appropriate     Co-evaluation              AM-PAC PT "6 Clicks" Mobility   Outcome Measure  Help needed turning from your back to your side while in a flat bed without using bedrails?: A Little Help needed moving from lying on your back to sitting on the side of a flat bed without using bedrails?: A Little Help needed moving to and from a bed to a chair (including a wheelchair)?: A Little Help needed standing up from a chair using your arms (e.g., wheelchair or bedside chair)?: A Little Help needed to walk in hospital room?: A Little Help needed climbing 3-5 steps with a railing? : A Little 6 Click Score: 18    End of Session Equipment Utilized During Treatment: Gait belt Activity Tolerance: Patient tolerated treatment well Patient left: in chair;with call bell/phone within reach;with chair alarm set Nurse Communication: Mobility status;Other (comment)(put on new finger probe due to poor reading) PT Visit Diagnosis: Unsteadiness on feet (R26.81);Muscle weakness (generalized) (M62.81);Other abnormalities of gait and mobility (R26.89)     Time: 2951-8841 PT Time Calculation (min) (ACUTE ONLY): 29 min  Charges:  $Gait Training: 8-22 mins $Therapeutic Exercise: 8-22 mins                       Barry Brunner, PT       Rexanne Mano 01/04/2019, 1:51 PM

## 2019-01-04 NOTE — Progress Notes (Signed)
PROGRESS NOTE  Billy Byrd HUD:[id number] DOB: [birth year] DOA: 12/27/2018  PCP: Orpah Cobb., MD  Brief History/Interval Summary: 78 y.o. male with a past medical history of coronary artery disease, status post PCI more than 2 years ago, on aspirin and Plavix, history of hypothyroidism, history of alcohol abuse, history of tobacco abuse who has been living with a sister for the past 1 month.  It is not clear why he moved in with his sister but apparently has been having frequent falls and has not been able to take care of himself, presumably due to his issues with alcoholism.  Patient apparently goes to a "club" where he consumes alcohol.  It is thought that that is where he contracted COVID-19.  He has been sick for about 5 to 6 days.  He was taken to the hospital in [facility] on Saturday and was hospitalized.  He was started on antibacterial agents.  Subsequently the COVID-19 test came back as being positive.  His oxygenation has worsened over the last 24 hours.  He was initially on 2 L of oxygen subsequently went to 6 L and then 10 L this morning.  Chest x-ray was repeated this morning which showed worsening bilateral infiltrates.  He was subsequently transferred over to this facility for further management.   Reason for Visit: Acute respiratory disease due to COVID-19  Consultants: None  Procedures: None  Antibiotics: He was on ceftriaxone and azithromycin at the outside facility.  Not continued.  Subjective/Interval History: Patient denies any complaints today, no significant events overnight   Assessment/Plan:  Acute Hypoxic Resp. Failure due to Acute Covid 19 Viral Illness  COVID-19 Labs  No results for input(s): DDIMER, FERRITIN, LDH, CRP in the last 72 hours.  Fever: Remains afebrile he is currently on room air Antibacterials: None Remdesivir: Completed course Steroids: Initially on Solu-Medrol.  Changed over to prednisone. Diuretics: Patient takes Lasix  on a regular basis at home.  Given intravenous dose on 8/6. Actemra: Not indicated Vitamin C and Zinc: Continue DVT Prophylaxis:  Lovenox 40 mg every 24 hours  Patient's respiratory status continues to improve.  He is currently on room air.  Completed course of Remdesivir , Appetite is improved.  Mentation also appears to be improving.  Continue incentive spirometry mobilization.     Acute metabolic encephalopathy Most likely due to combination of acute illness as well as his history of alcoholism.  Mental status has been improving.  Seems to be close to baseline.  No neurological deficits.    NSVT Noted to have multiple episodes of nonsustained ventricular tachycardia on 8/6.  Patient remained asymptomatic.  EKG did not show any concerning findings. Potassium was supplemented.  Patient was started on beta-blocker.  No further episodes of nonsustained ventricular tachycardia in the last 2 days.  Apparently he is followed by a cardiologist in [place].  According to care everywhere he had a stress test in 2017 which showed normal systolic function at that time.  Patient will need outpatient follow-up with cardiology.  History of coronary artery disease He has had stent placement previously.  Followed by cardiologist in [place].  Continue aspirin, Plavix and statin.  Beta-blockers initiated as discussed above.  Remained stable from a cardiac standpoint.  Macrocytic anemia No evidence of overt bleeding. Anemia panel reviewed.  No clear deficiencies identified.  Continue vitamin B12.  Hemoglobin is stable  History of hypothyroidism Continue levothyroxine.  TSH is low most likely due to sick euthyroid.  Free T4  is normal at 0.96.  History of vitamin B12 deficiency Continue vitamin B12.  History of alcohol abuse with alcohol withdrawal syndrome at the outside hospital No evidence for withdrawal at this time.  Continue thiamine multivitamins folic acid.  Has been on CIWA protocol.  DVT  Prophylaxis: Lovenox PUD Prophylaxis: Protonix Code Status: Full code Family Communication: Discussed with patient.  Disposition Plan: SNF, awaiting bed availability    Medications:  Scheduled: . aspirin EC  81 mg Oral Daily  . atorvastatin  10 mg Oral Daily  . clopidogrel  75 mg Oral Daily  . enoxaparin (LOVENOX) injection  40 mg Subcutaneous BID  . folic acid  1 mg Oral Daily  . Ipratropium-Albuterol  1 puff Inhalation Q6H  . levothyroxine  150 mcg Oral Daily  . metoprolol tartrate  25 mg Oral BID  . multivitamin with minerals  1 tablet Oral Daily  . nicotine  14 mg Transdermal Daily  . pantoprazole  40 mg Oral Daily  . predniSONE  40 mg Oral Q breakfast  . senna  1 tablet Oral BID  . sodium chloride flush  3 mL Intravenous Q12H  . thiamine  100 mg Oral Daily  . traZODone  50 mg Oral QHS  . vitamin B-12  500 mcg Oral Daily  . vitamin C  500 mg Oral Daily  . zinc sulfate  220 mg Oral Daily   Continuous: . sodium chloride Stopped (12/28/18 1858)   ZOX:WRUEAV chloride, acetaminophen, guaiFENesin-dextromethorphan, polyethylene glycol, sodium chloride flush   Objective:  Vital Signs  Vitals:   01/03/19 1940 01/04/19 0529 01/04/19 0756 01/04/19 1131  BP: 115/74 110/76 102/65 98/67  Pulse: 82 69 73 78  Resp: 13 13 14 17   Temp: 98.2 F (36.8 C) 97.7 F (36.5 C) 97.7 F (36.5 C) 98 F (36.7 C)  TempSrc: Oral Oral Oral Oral  SpO2: 100% 96% 97% 100%  Weight:  88.2 kg    Height:        Intake/Output Summary (Last 24 hours) at 01/04/2019 1352 Last data filed at 01/04/2019 4098 Gross per 24 hour  Intake 480 ml  Output 700 ml  Net -220 ml   Filed Weights   12/31/18 0500 01/02/19 0422 01/04/19 0529  Weight: 92.3 kg 88.6 kg 88.2 kg    Awake Alert, pleasant, sitting in recliner in no apparent distress Good air entry bilaterally Regular rate and rhythm Abdomen soft, bowel sounds present No Cyanosis, Clubbing or edema, No new Rash or bruise       Lab Results:   Data Reviewed: I have personally reviewed following labs and imaging studies  CBC: Recent Labs  Lab 12/29/18 0305 12/30/18 0244 12/31/18 0223  WBC 11.1* 11.8* 11.1*  NEUTROABS 9.7* 10.4* 9.2*  HGB 12.8* 12.4* 12.9*  HCT 37.8* 37.8* 38.6*  MCV 98.7 100.3* 99.7  PLT 238 301 119    Basic Metabolic Panel: Recent Labs  Lab 12/28/18 1648 12/29/18 0305 12/30/18 0244 12/31/18 0223 01/01/19 0252  NA 138 139 136 139 135  K 3.8 4.1 4.0 4.1 4.0  CL 98 102 100 102 98  CO2 28 28 27 28 27   GLUCOSE 123* 120* 122* 90 80  BUN 22 21 24* 28* 26*  CREATININE 1.03 0.96 0.97 0.93 0.96  CALCIUM 9.0 8.9 8.8* 8.8* 8.7*  MG 2.0 2.0 2.1 2.2 2.1    GFR: Estimated Creatinine Clearance: 82.9 mL/min (by C-G formula based on SCr of 0.96 mg/dL).  Liver Function Tests: Recent Labs  Lab 12/29/18  9923 12/30/18 0244 12/31/18 0223 01/01/19 0252  AST 29 23 28 24   ALT 29 27 32 29  ALKPHOS 56 52 52 60  BILITOT 0.5 0.8 0.5 0.6  PROT 6.1* 5.9* 5.8* 5.9*  ALBUMIN 2.9* 2.7* 2.7* 2.8*     Anemia Panel: No results for input(s): VITAMINB12, FOLATE, FERRITIN, TIBC, IRON, RETICCTPCT in the last 72 hours.    Radiology Studies: No results found.     LOS: 8 days   Phillips Climes MD  [facility] Pager on [email or web address]  01/04/2019, 1:52 PM

## 2019-01-04 NOTE — Care Management (Signed)
Patient has been faxed out, waiting for bed offers. Will continue to follow.    Ricki Miller, RN BSN Case Manager [phone]

## 2019-01-04 NOTE — Progress Notes (Signed)
Called to update Billy Byrd patient's main contact, but there was no answer.

## 2019-01-04 NOTE — Progress Notes (Signed)
Pt given full bath. Pt had small condom catheter in place prior to shift. Pt complaining of soreness. Genitals assessed with nurse tech at bedside. Area swollen with cirmcumferential redness on penis. Redness around scrotum and inner thigh. Small condom cathter removed and discarded. Pt educated on how to use the urinal. Full linen changed performed, floor mats placed on floor, and call bell within reach.

## 2019-01-04 NOTE — Care Management (Signed)
Case manager spoke with patient's sister [name], she states she would like to see if her brother can get a bed at [facility] in [place], [place], she is also agreeable to his being faxed out to [place]. Case manager will do so this am.    Ricki Miller, RN Case Manager [phone]

## 2019-01-05 ENCOUNTER — Other Ambulatory Visit: Payer: Self-pay

## 2019-01-05 MED ORDER — NYSTATIN 100000 UNIT/GM EX POWD
Freq: Three times a day (TID) | CUTANEOUS | Status: DC
  Administered 2019-01-05 – 2019-01-13 (×24): via TOPICAL
  Filled 2019-01-05: qty 15

## 2019-01-05 NOTE — Progress Notes (Signed)
Occupational Therapy Treatment Patient Details Name: Billy Byrd MRN: [id number] DOB: 1940/07/24 Today's Date: 01/05/2019    History of present illness Pt is a 78 y.o. male admitted 12/27/18 with SOB and worsening oxygenation; COVID-19 positive. CXR with worsening bilateral infiltrates. Pt also with acute metabolic encephalopathy. PMH includes CAD, ETOH/tobacco abuse, OSA.   OT comments  Pt progressing toward goals. Pt ambulated around room with min A and then completed ADL session with min A for LB and set up/S for UB. VSS throughout. Pt with apparent cognitive deficits, although improved performance as compared to previous session. Continue to recommend rehab at SNF to facilitate safe DC home. Pt very pleasant.    Follow Up Recommendations  SNF;Supervision/Assistance - 24 hour    Equipment Recommendations  3 in 1 bedside commode    Recommendations for Other Services      Precautions / Restrictions Precautions Precautions: Fall Restrictions Weight Bearing Restrictions: No       Mobility Bed Mobility Overal bed mobility: Needs Assistance Bed Mobility: Supine to Sit     Supine to sit: Supervision;HOB elevated        Transfers Overall transfer level: Needs assistance Equipment used: Rolling walker (2 wheeled) Transfers: Sit to/from Stand Sit to Stand: Min guard              Balance Overall balance assessment: Needs assistance   Sitting balance-Leahy Scale: Good       Standing balance-Leahy Scale: Poor Standing balance comment: requires UE support                           ADL either performed or assessed with clinical judgement   ADL Overall ADL's : Needs assistance/impaired Eating/Feeding: Set up;Sitting   Grooming: Set up;Sitting   Upper Body Bathing: Set up;Sitting   Lower Body Bathing: Min guard;Sit to/from stand   Upper Body Dressing : Set up;Sitting   Lower Body Dressing: Minimal assistance;Sit to/from stand   Toilet Transfer:  Minimal assistance;RW;Ambulation   Toileting- Water quality scientist and Hygiene: Min guard;Sit to/from stand       Functional mobility during ADLs: Minimal assistance;Rolling walker;Cueing for safety General ADL Comments: unsafe wtih mobility; does not appear to have used a RW before     Vision       Perception     Praxis      Cognition Arousal/Alertness: Awake/alert Behavior During Therapy: WFL for tasks assessed/performed Overall Cognitive Status: No family/caregiver present to determine baseline cognitive functioning Area of Impairment: Orientation;Attention;Memory;Safety/judgement;Awareness;Problem solving                 Orientation Level: Disoriented to;Place;Time Current Attention Level: Sustained Memory: Decreased recall of precautions;Decreased short-term memory Following Commands: Follows one step commands consistently Safety/Judgement: Decreased awareness of safety;Decreased awareness of deficits(enterred room with bed alarm sounding) Awareness: Emergent Problem Solving: Slow processing General Comments: improved cognition form last session although imparied        Exercises Other Exercises Other Exercises: seated: marching x 20; LAQ x 10   Shoulder Instructions       General Comments Ambulated in room and to window; looked out window and commented on surroundings    Pertinent Vitals/ Pain       Pain Assessment: No/denies pain  Home Living  Prior Functioning/Environment              Frequency  Min 3X/week        Progress Toward Goals  OT Goals(current goals can now be found in the care plan section)  Progress towards OT goals: Progressing toward goals  Acute Rehab OT Goals Patient Stated Goal: to go home OT Goal Formulation: With patient Time For Goal Achievement: 01/11/19 Potential to Achieve Goals: Good ADL Goals Pt Will Perform Grooming: standing;with  supervision Pt Will Perform Lower Body Bathing: sit to/from stand;with supervision Pt Will Perform Upper Body Dressing: with modified independence;sitting Pt Will Perform Lower Body Dressing: sit to/from stand;with supervision Pt Will Transfer to Toilet: ambulating;with supervision Pt Will Perform Toileting - Clothing Manipulation and hygiene: with supervision;sit to/from stand Pt/caregiver will Perform Home Exercise Program: Increased strength;Both right and left upper extremity;With theraband;With written HEP provided Additional ADL Goal #1: Pt will demonstrate anticipatory awareness during functional task.  Plan Discharge plan remains appropriate    Co-evaluation                 AM-PAC OT "6 Clicks" Daily Activity     Outcome Measure   Help from another person eating meals?: A Little Help from another person taking care of personal grooming?: A Little Help from another person toileting, which includes using toliet, bedpan, or urinal?: A Little Help from another person bathing (including washing, rinsing, drying)?: A Little Help from another person to put on and taking off regular upper body clothing?: A Little Help from another person to put on and taking off regular lower body clothing?: A Little 6 Click Score: 18    End of Session Equipment Utilized During Treatment: Gait belt  OT Visit Diagnosis: Unsteadiness on feet (R26.81);Other symptoms and signs involving cognitive function;Muscle weakness (generalized) (M62.81)   Activity Tolerance Patient tolerated treatment well   Patient Left in chair;with call bell/phone within reach;with chair alarm set   Nurse Communication Mobility status        Time: 2707-8675 OT Time Calculation (min): 25 min  Charges: OT General Charges $OT Visit: 1 Visit OT Treatments $Self Care/Home Management : 23-37 mins  Maurie Boettcher, OT/L   Acute OT Clinical Specialist [place] Pager [phone] Office  [phone]    [facility] 01/05/2019, 11:58 AM

## 2019-01-05 NOTE — Progress Notes (Signed)
PROGRESS NOTE  Billy Byrd XNA:[id number] DOB: [birth year] DOA: 12/27/2018  PCP: Orpah Cobb., MD  Brief History/Interval Summary: 78 y.o. male with a past medical history of coronary artery disease, status post PCI more than 2 years ago, on aspirin and Plavix, history of hypothyroidism, history of alcohol abuse, history of tobacco abuse who has been living with a sister for the past 1 month.  It is not clear why he moved in with his sister but apparently has been having frequent falls and has not been able to take care of himself, presumably due to his issues with alcoholism.  Patient apparently goes to a "club" where he consumes alcohol.  It is thought that that is where he contracted COVID-19.  He has been sick for about 5 to 6 days.  He was taken to the hospital in [facility] on Saturday and was hospitalized.  He was started on antibacterial agents.  Subsequently the COVID-19 test came back as being positive.  His oxygenation has worsened over the last 24 hours.  He was initially on 2 L of oxygen subsequently went to 6 L and then 10 L this morning.  Chest x-ray was repeated this morning which showed worsening bilateral infiltrates.  He was subsequently transferred over to this facility for further management.   Reason for Visit: Acute respiratory disease due to COVID-19  Consultants: None  Procedures: None  Antibiotics: He was on ceftriaxone and azithromycin at the outside facility.  Not continued.  Subjective/Interval History: Patient denies any complaints today, no significant events overnight   Assessment/Plan:  Acute Hypoxic Resp. Failure due to Acute Covid 19 Viral Illness  COVID-19 Labs  No results for input(s): DDIMER, FERRITIN, LDH, CRP in the last 72 hours.  Fever: Remains afebrile he is currently on room air Antibacterials: None Remdesivir: Completed course Steroids: Initially on Solu-Medrol.  Changed over to prednisone. Diuretics: Patient takes Lasix  on a regular basis at home.  Given intravenous dose on 8/6. Actemra: Not indicated Vitamin C and Zinc: Continue DVT Prophylaxis:  Lovenox 40 mg every 24 hours  Patient's respiratory status continues to improve.  He is currently on room air.  Completed course of Remdesivir , Appetite is improved.  Mentation also appears to be improving.  Continue incentive spirometry mobilization.     Acute metabolic encephalopathy Most likely due to combination of acute illness as well as his history of alcoholism.  Mental status has been improving.  Seems to be close to baseline.  No neurological deficits.    NSVT Noted to have multiple episodes of nonsustained ventricular tachycardia on 8/6.  Patient remained asymptomatic.  EKG did not show any concerning findings. Potassium was supplemented.  Patient was started on beta-blocker.  No further episodes of nonsustained ventricular tachycardia in the last 2 days.  Apparently he is followed by a cardiologist in [place].  According to care everywhere he had a stress test in 2017 which showed normal systolic function at that time.  Patient will need outpatient follow-up with cardiology.  History of coronary artery disease He has had stent placement previously.  Followed by cardiologist in [place].  Continue aspirin, Plavix and statin.  Beta-blockers initiated as discussed above.  Remained stable from a cardiac standpoint.  Macrocytic anemia No evidence of overt bleeding. Anemia panel reviewed.  No clear deficiencies identified.  Continue vitamin B12.  Hemoglobin is stable  History of hypothyroidism Continue levothyroxine.  TSH is low most likely due to sick euthyroid.  Free T4  is normal at 0.96.  History of vitamin B12 deficiency Continue vitamin B12.  History of alcohol abuse with alcohol withdrawal syndrome at the outside hospital No evidence for withdrawal at this time.  Continue thiamine multivitamins folic acid.  Has been on CIWA protocol.  DVT  Prophylaxis: Lovenox PUD Prophylaxis: Protonix Code Status: Full code Family Communication: Discussed with patient.  Disposition Plan: SNF, awaiting bed availability    Medications:  Scheduled: . aspirin EC  81 mg Oral Daily  . atorvastatin  10 mg Oral Daily  . clopidogrel  75 mg Oral Daily  . enoxaparin (LOVENOX) injection  40 mg Subcutaneous BID  . folic acid  1 mg Oral Daily  . Ipratropium-Albuterol  1 puff Inhalation Q6H  . levothyroxine  150 mcg Oral Daily  . metoprolol tartrate  25 mg Oral BID  . multivitamin with minerals  1 tablet Oral Daily  . nicotine  14 mg Transdermal Daily  . nystatin   Topical TID  . pantoprazole  40 mg Oral Daily  . predniSONE  40 mg Oral Q breakfast  . senna  1 tablet Oral BID  . sodium chloride flush  3 mL Intravenous Q12H  . thiamine  100 mg Oral Daily  . traZODone  50 mg Oral QHS  . vitamin B-12  500 mcg Oral Daily  . vitamin C  500 mg Oral Daily  . zinc sulfate  220 mg Oral Daily   Continuous: . sodium chloride Stopped (12/28/18 1858)   ZOX:WRUEAV chloride, acetaminophen, guaiFENesin-dextromethorphan, polyethylene glycol, sodium chloride flush   Objective:  Vital Signs  Vitals:   01/04/19 2035 01/05/19 0100 01/05/19 0652 01/05/19 0810  BP: 129/84  111/72 117/75  Pulse: 74 65 71 71  Resp: 15 11 11 15   Temp: 98 F (36.7 C)  97.9 F (36.6 C) 98 F (36.7 C)  TempSrc: Oral  Oral Oral  SpO2: 96% 95% 96% 94%  Weight:      Height:        Intake/Output Summary (Last 24 hours) at 01/05/2019 1157 Last data filed at 01/05/2019 0844 Gross per 24 hour  Intake 603 ml  Output 1475 ml  Net -872 ml   Filed Weights   12/31/18 0500 01/02/19 0422 01/04/19 0529  Weight: 92.3 kg 88.6 kg 88.2 kg    Awake, pleasant, eating breakfast in bed Good air entry laterally Regular rate and rhythm Abdomen soft, bowel sounds present No Cyanosis, Clubbing or edema, No new Rash or bruise       Lab Results:  Data Reviewed: I have personally  reviewed following labs and imaging studies  CBC: Recent Labs  Lab 12/30/18 0244 12/31/18 0223  WBC 11.8* 11.1*  NEUTROABS 10.4* 9.2*  HGB 12.4* 12.9*  HCT 37.8* 38.6*  MCV 100.3* 99.7  PLT 301 409    Basic Metabolic Panel: Recent Labs  Lab 12/30/18 0244 12/31/18 0223 01/01/19 0252  NA 136 139 135  K 4.0 4.1 4.0  CL 100 102 98  CO2 27 28 27   GLUCOSE 122* 90 80  BUN 24* 28* 26*  CREATININE 0.97 0.93 0.96  CALCIUM 8.8* 8.8* 8.7*  MG 2.1 2.2 2.1    GFR: Estimated Creatinine Clearance: 82.9 mL/min (by C-G formula based on SCr of 0.96 mg/dL).  Liver Function Tests: Recent Labs  Lab 12/30/18 0244 12/31/18 0223 01/01/19 0252  AST 23 28 24   ALT 27 32 29  ALKPHOS 52 52 60  BILITOT 0.8 0.5 0.6  PROT 5.9* 5.8* 5.9*  ALBUMIN 2.7* 2.7* 2.8*     Anemia Panel: No results for input(s): VITAMINB12, FOLATE, FERRITIN, TIBC, IRON, RETICCTPCT in the last 72 hours.    Radiology Studies: No results found.     LOS: 9 days   Phillips Climes MD  [facility] Pager on [email or web address]  01/05/2019, 11:57 AM

## 2019-01-05 NOTE — Care Management (Signed)
Case manager continues to pursue locating a facility that will accept patient. Waiting for return call from admission coordinator at [facility].   Ricki Miller, RN BSN Case Manager [phone]

## 2019-01-06 NOTE — Progress Notes (Signed)
Called and spoke with [name] regarding patient's progress during the day and early night.  All concerns addressed.  Brother was very appreciative of the care the patient was receiving.  Earleen Reaper RN

## 2019-01-06 NOTE — Progress Notes (Signed)
PROGRESS NOTE  Billy Byrd BTD:[id number] DOB: 10-26-1940 DOA: 12/27/2018  PCP: Orpah Cobb., MD  Brief History/Interval Summary: 78 y.o. male with a past medical history of coronary artery disease, status post PCI more than 2 years ago, on aspirin and Plavix, history of hypothyroidism, history of alcohol abuse, history of tobacco abuse who has been living with a sister for the past 1 month.  It is not clear why he moved in with his sister but apparently has been having frequent falls and has not been able to take care of himself, presumably due to his issues with alcoholism.  Patient apparently goes to a "club" where he consumes alcohol.  It is thought that that is where he contracted COVID-19.  He has been sick for about 5 to 6 days.  He was taken to the hospital in [facility] on Saturday and was hospitalized.  He was started on antibacterial agents.  Subsequently the COVID-19 test came back as being positive.  His oxygenation has worsened over the last 24 hours.  He was initially on 2 L of oxygen subsequently went to 6 L and then 10 L this morning.  Chest x-ray was repeated this morning which showed worsening bilateral infiltrates.  He was subsequently transferred over to this facility for further management.   Reason for Visit: Acute respiratory disease due to COVID-19  Consultants: None  Procedures: None  Antibiotics: He was on ceftriaxone and azithromycin at the outside facility.  Not continued.  Subjective/Interval History: Patient denies any complaints today, no significant events overnight   Assessment/Plan:  Acute Hypoxic Resp. Failure due to Acute Covid 19 Viral Illness  COVID-19 Labs  No results for input(s): DDIMER, FERRITIN, LDH, CRP in the last 72 hours.  Fever: Remains afebrile he is currently on room air Antibacterials: None Remdesivir: Completed course Steroids: Initially on Solu-Medrol.  Changed over to prednisone. Diuretics: Patient takes Lasix  on a regular basis at home.  Given intravenous dose on 8/6. Actemra: Not indicated Vitamin C and Zinc: Continue DVT Prophylaxis:  Lovenox 40 mg every 24 hours  Patient's respiratory status continues to improve.  He is currently on room air.  Completed course of Remdesivir , Appetite is improved.  Mentation also appears to be improving.  Continue incentive spirometry mobilization.     Acute metabolic encephalopathy Most likely due to combination of acute illness as well as his history of alcoholism.  Mental status has been improving.  Seems to be close to baseline.  No neurological deficits.    NSVT Noted to have multiple episodes of nonsustained ventricular tachycardia on 8/6.  Patient remained asymptomatic.  EKG did not show any concerning findings. Potassium was supplemented.  Patient was started on beta-blocker.  No further episodes of nonsustained ventricular tachycardia in the last 2 days.  Apparently he is followed by a cardiologist in [place].  According to care everywhere he had a stress test in 2017 which showed normal systolic function at that time.  Patient will need outpatient follow-up with cardiology.  History of coronary artery disease He has had stent placement previously.  Followed by cardiologist in [place].  Continue aspirin, Plavix and statin.  Beta-blockers initiated as discussed above.  Remained stable from a cardiac standpoint.  Macrocytic anemia No evidence of overt bleeding. Anemia panel reviewed.  No clear deficiencies identified.  Continue vitamin B12.  Hemoglobin is stable  History of hypothyroidism Continue levothyroxine.  TSH is low most likely due to sick euthyroid.  Free T4  is normal at 0.96.  History of vitamin B12 deficiency Continue vitamin B12.  History of alcohol abuse with alcohol withdrawal syndrome at the outside hospital No evidence for withdrawal at this time.  Continue thiamine multivitamins folic acid.  Has been on CIWA protocol.  DVT  Prophylaxis: Lovenox PUD Prophylaxis: Protonix Code Status: Full code Family Communication: Discussed with patient.  Disposition Plan: SNF, awaiting bed availability    Medications:  Scheduled: . aspirin EC  81 mg Oral Daily  . atorvastatin  10 mg Oral Daily  . clopidogrel  75 mg Oral Daily  . enoxaparin (LOVENOX) injection  40 mg Subcutaneous BID  . folic acid  1 mg Oral Daily  . Ipratropium-Albuterol  1 puff Inhalation Q6H  . levothyroxine  150 mcg Oral Daily  . metoprolol tartrate  25 mg Oral BID  . multivitamin with minerals  1 tablet Oral Daily  . nicotine  14 mg Transdermal Daily  . nystatin   Topical TID  . pantoprazole  40 mg Oral Daily  . predniSONE  40 mg Oral Q breakfast  . senna  1 tablet Oral BID  . sodium chloride flush  3 mL Intravenous Q12H  . thiamine  100 mg Oral Daily  . traZODone  50 mg Oral QHS  . vitamin B-12  500 mcg Oral Daily  . vitamin C  500 mg Oral Daily  . zinc sulfate  220 mg Oral Daily   Continuous: . sodium chloride Stopped (12/28/18 1858)   PPI:RJJOAC chloride, acetaminophen, guaiFENesin-dextromethorphan, polyethylene glycol, sodium chloride flush   Objective:  Vital Signs  Vitals:   01/06/19 0545 01/06/19 0600 01/06/19 0755 01/06/19 1122  BP: 114/72  107/67 128/84  Pulse:  69 69 83  Resp:  12 20 12   Temp: 97.6 F (36.4 C)  98 F (36.7 C)   TempSrc: Oral  Oral   SpO2: 93% (!) 89% 90% 94%  Weight:      Height:        Intake/Output Summary (Last 24 hours) at 01/06/2019 1327 Last data filed at 01/06/2019 1122 Gross per 24 hour  Intake 1083 ml  Output 500 ml  Net 583 ml   Filed Weights   01/02/19 0422 01/04/19 0529 01/06/19 0500  Weight: 88.6 kg 88.2 kg 90.6 kg    Awake, pleasant, in no apparent distress  Good air entry bilaterally Regular rate and rhythm [place] soft, bowel sounds present No Cyanosis, Clubbing or edema, No new Rash or bruise       Lab Results:  Data Reviewed: I have personally reviewed following  labs and imaging studies  CBC: Recent Labs  Lab 12/31/18 0223  WBC 11.1*  NEUTROABS 9.2*  HGB 12.9*  HCT 38.6*  MCV 99.7  PLT 166    Basic Metabolic Panel: Recent Labs  Lab 12/31/18 0223 01/01/19 0252  NA 139 135  K 4.1 4.0  CL 102 98  CO2 28 27  GLUCOSE 90 80  BUN 28* 26*  CREATININE 0.93 0.96  CALCIUM 8.8* 8.7*  MG 2.2 2.1    GFR: Estimated Creatinine Clearance: 83.8 mL/min (by C-G formula based on SCr of 0.96 mg/dL).  Liver Function Tests: Recent Labs  Lab 12/31/18 0223 01/01/19 0252  AST 28 24  ALT 32 29  ALKPHOS 52 60  BILITOT 0.5 0.6  PROT 5.8* 5.9*  ALBUMIN 2.7* 2.8*     Anemia Panel: No results for input(s): VITAMINB12, FOLATE, FERRITIN, TIBC, IRON, RETICCTPCT in the last 72 hours.  Radiology Studies: No results found.     LOS: 10 days   Phillips Climes MD  [facility] Pager on [email or web address]  01/06/2019, 1:27 PM

## 2019-01-07 NOTE — Progress Notes (Signed)
Updates given to POA

## 2019-01-07 NOTE — Progress Notes (Signed)
PROGRESS NOTE  Billy Byrd AQT:[id number] DOB: [birth year] DOA: 12/27/2018  PCP: Orpah Cobb., MD  Brief History/Interval Summary: 78 y.o. male with a past medical history of coronary artery disease, status post PCI more than 2 years ago, on aspirin and Plavix, history of hypothyroidism, history of alcohol abuse, history of tobacco abuse who has been living with a sister for the past 1 month.  It is not clear why he moved in with his sister but apparently has been having frequent falls and has not been able to take care of himself, presumably due to his issues with alcoholism.  Patient apparently goes to a "club" where he consumes alcohol.  It is thought that that is where he contracted COVID-19.  He has been sick for about 5 to 6 days.  He was taken to the hospital in [facility] on Saturday and was hospitalized.  He was started on antibacterial agents.  Subsequently the COVID-19 test came back as being positive.  His oxygenation has worsened over the last 24 hours.  He was initially on 2 L of oxygen subsequently went to 6 L and then 10 L this morning.  Chest x-ray was repeated this morning which showed worsening bilateral infiltrates.  He was subsequently transferred over to this facility for further management.   Reason for Visit: Acute respiratory disease due to COVID-19  Consultants: None  Procedures: None  Antibiotics: He was on ceftriaxone and azithromycin at the outside facility.  Not continued.  Subjective/Interval History: Patient denies denies any complaints today, reports he had a good night sleep, no chest pain, no shortness of breath, no nausea or vomiting.   Assessment/Plan:  Acute Hypoxic Resp. Failure due to Acute Covid 19 Viral Illness  COVID-19 Labs  No results for input(s): DDIMER, FERRITIN, LDH, CRP in the last 72 hours.  Fever: Remains afebrile he is currently on room air Antibacterials: None Remdesivir: Completed course Steroids: Initially on  Solu-Medrol.  Changed over to prednisone. Diuretics: Patient takes Lasix on a regular basis at home.  Given intravenous dose on 8/6. Actemra: Not indicated Vitamin C and Zinc: Continue DVT Prophylaxis:  Lovenox 40 mg every 24 hours  Patient's respiratory status continues to improve.  He is currently on room air.  Completed course of Remdesivir , Appetite is improved.  Mentation also appears to be improving.  Continue incentive spirometry mobilization.     Acute metabolic encephalopathy Most likely due to combination of acute illness as well as his history of alcoholism.  Mental status has been improving.  Seems to be close to baseline.  No neurological deficits.    NSVT Noted to have multiple episodes of nonsustained ventricular tachycardia on 8/6.  Patient remained asymptomatic.  EKG did not show any concerning findings. Potassium was supplemented.  Patient was started on beta-blocker.  No further episodes of nonsustained ventricular tachycardia in the last 2 days.  Apparently he is followed by a cardiologist in [place].  According to care everywhere he had a stress test in 2017 which showed normal systolic function at that time.  Patient will need outpatient follow-up with cardiology.  History of coronary artery disease He has had stent placement previously.  Followed by cardiologist in [place].  Continue aspirin, Plavix and statin.  Beta-blockers initiated as discussed above.  Remained stable from a cardiac standpoint.  Macrocytic anemia No evidence of overt bleeding. Anemia panel reviewed.  No clear deficiencies identified.  Continue vitamin B12.  Hemoglobin is stable  History of hypothyroidism  Continue levothyroxine.  TSH is low most likely due to sick euthyroid.  Free T4 is normal at 0.96.  History of vitamin B12 deficiency Continue vitamin B12.  History of alcohol abuse with alcohol withdrawal syndrome at the outside hospital No evidence for withdrawal at this time.  Continue  thiamine multivitamins folic acid.  Has been on CIWA protocol.  DVT Prophylaxis: Lovenox PUD Prophylaxis: Protonix Code Status: Full code Family Communication: Discussed with patient.  Disposition Plan: SNF, awaiting bed availability    Medications:  Scheduled: . aspirin EC  81 mg Oral Daily  . atorvastatin  10 mg Oral Daily  . clopidogrel  75 mg Oral Daily  . enoxaparin (LOVENOX) injection  40 mg Subcutaneous BID  . folic acid  1 mg Oral Daily  . Ipratropium-Albuterol  1 puff Inhalation Q6H  . levothyroxine  150 mcg Oral Daily  . metoprolol tartrate  25 mg Oral BID  . multivitamin with minerals  1 tablet Oral Daily  . nicotine  14 mg Transdermal Daily  . nystatin   Topical TID  . pantoprazole  40 mg Oral Daily  . predniSONE  40 mg Oral Q breakfast  . senna  1 tablet Oral BID  . sodium chloride flush  3 mL Intravenous Q12H  . thiamine  100 mg Oral Daily  . traZODone  50 mg Oral QHS  . vitamin B-12  500 mcg Oral Daily  . vitamin C  500 mg Oral Daily  . zinc sulfate  220 mg Oral Daily   Continuous: . sodium chloride Stopped (12/28/18 1858)   VZD:GLOVFI chloride, acetaminophen, guaiFENesin-dextromethorphan, polyethylene glycol, sodium chloride flush   Objective:  Vital Signs  Vitals:   01/07/19 0521 01/07/19 0600 01/07/19 0724 01/07/19 0910  BP: 103/73  111/68   Pulse: (!) 56 (!) 59 67 78  Resp: 15 11 12    Temp: 97.7 F (36.5 C)  98.3 F (36.8 C)   TempSrc: Oral  Oral   SpO2: 93% 94% 92%   Weight: 89.2 kg     Height:        Intake/Output Summary (Last 24 hours) at 01/07/2019 1344 Last data filed at 01/07/2019 0919 Gross per 24 hour  Intake 1443 ml  Output 1375 ml  Net 68 ml   Filed Weights   01/04/19 0529 01/06/19 0500 01/07/19 0521  Weight: 88.2 kg 90.6 kg 89.2 kg    Awake, pleasant, sitting in bed in no apparent distress Good air entry bilaterally Regular rate and rhythm Soft abdomen, bowel sounds present No Cyanosis, Clubbing or edema, No new  Rash or bruise       Lab Results:  Data Reviewed: I have personally reviewed following labs and imaging studies  CBC: No results for input(s): WBC, NEUTROABS, HGB, HCT, MCV, PLT in the last 168 hours.  Basic Metabolic Panel: Recent Labs  Lab 01/01/19 0252  NA 135  K 4.0  CL 98  CO2 27  GLUCOSE 80  BUN 26*  CREATININE 0.96  CALCIUM 8.7*  MG 2.1    GFR: Estimated Creatinine Clearance: 83.8 mL/min (by C-G formula based on SCr of 0.96 mg/dL).  Liver Function Tests: Recent Labs  Lab 01/01/19 0252  AST 24  ALT 29  ALKPHOS 60  BILITOT 0.6  PROT 5.9*  ALBUMIN 2.8*     Anemia Panel: No results for input(s): VITAMINB12, FOLATE, FERRITIN, TIBC, IRON, RETICCTPCT in the last 72 hours.    Radiology Studies: No results found.     LOS: 11  days   Phillips Climes MD  [facility] Pager on [email or web address]  01/07/2019, 1:44 PM

## 2019-01-08 ENCOUNTER — Encounter (HOSPITAL_COMMUNITY): Payer: Self-pay

## 2019-01-08 LAB — BASIC METABOLIC PANEL
Anion gap: 9 (ref 5–15)
BUN: 23 mg/dL (ref 8–23)
CO2: 26 mmol/L (ref 22–32)
Calcium: 8.9 mg/dL (ref 8.9–10.3)
Chloride: 100 mmol/L (ref 98–111)
Creatinine, Ser: 1.06 mg/dL (ref 0.61–1.24)
GFR calc Af Amer: >60 mL/min (ref >60)
GFR calc non Af Amer: >60 mL/min (ref >60)
Glucose, Bld: 66 mg/dL — ABNORMAL LOW (ref 70–99)
Potassium: 4.1 mmol/L (ref 3.5–5.1)
Sodium: 135 mmol/L (ref 135–145)

## 2019-01-08 LAB — CBC
HCT: 38.1 % — ABNORMAL LOW (ref 39.0–52.0)
Hemoglobin: 12.8 g/dL — ABNORMAL LOW (ref 13.0–17.0)
MCH: 33.3 pg (ref 26.0–34.0)
MCHC: 33.6 g/dL (ref 30.0–36.0)
MCV: 99.2 fL (ref 80.0–100.0)
Platelets: 284 10*3/uL (ref 150–400)
RBC: 3.84 MIL/uL — ABNORMAL LOW (ref 4.22–5.81)
RDW: 12.6 % (ref 11.5–15.5)
WBC: 12 10*3/uL — ABNORMAL HIGH (ref 4.0–10.5)
nRBC: 0 % (ref 0.0–0.2)

## 2019-01-08 NOTE — Progress Notes (Deleted)
PROGRESS NOTE  Billy Byrd WUX:[id number] DOB: [birth year] DOA: 12/27/2018  PCP: Orpah Cobb., MD  Brief History/Interval Summary:  78 y.o. male with a past medical history of coronary artery disease, status post PCI more than 2 years ago, on aspirin and Plavix, history of hypothyroidism, history of alcohol abuse, history of tobacco abuse who has been living with a sister for the past 1 month.  It is not clear why he moved in with his sister but apparently has been having frequent falls and has not been able to take care of himself, presumably due to his issues with alcoholism.  Patient apparently goes to a "club" where he consumes alcohol.  It is thought that that is where he contracted COVID-19.  He has been sick for about 5 to 6 days.  He was taken to the hospital in [facility] on Saturday and was hospitalized.  He was started on antibacterial agents.  Subsequently the COVID-19 test came back as being positive.  His oxygenation has worsened over the last 24 hours.  He was initially on 2 L of oxygen subsequently went to 6 L and then 10 L this morning.  Chest x-ray was repeated this morning which showed worsening bilateral infiltrates.  He was subsequently transferred over to this facility for further management.   Reason for Visit: Acute respiratory disease due to COVID-19  Consultants: None  Procedures: None  Antibiotics: He was on ceftriaxone and azithromycin at the outside facility.  Not continued.  Subjective/Interval History: Patient denies denies any complaints today, no nausea, vomiting, chest pain or shortness of breath  Assessment/Plan:  Acute Hypoxic Resp. Failure due to Acute Covid 19 Viral Illness  COVID-19 Labs  No results for input(s): DDIMER, FERRITIN, LDH, CRP in the last 72 hours.  Fever: Remains afebrile he is currently on room air Antibacterials: None Remdesivir: Completed course Steroids: Initially on Solu-Medrol.  Changed over to prednisone.  Diuretics: Patient takes Lasix on a regular basis at home.  Given intravenous dose on 8/6. Actemra: Not indicated Vitamin C and Zinc: Continue DVT Prophylaxis:  Lovenox 40 mg every 24 hours  Patient's respiratory status continues to improve.  He is currently on room air.  Completed course of Remdesivir , Appetite is improved.  Mentation also appears to be improving.  Continue incentive spirometry mobilization.     Acute metabolic encephalopathy Most likely due to combination of acute illness as well as his history of alcoholism.  Mental status has been improving.  Seems to be close to baseline.  No neurological deficits.    NSVT Noted to have multiple episodes of nonsustained ventricular tachycardia on 8/6.  Patient remained asymptomatic.  EKG did not show any concerning findings. Potassium was supplemented.  Patient was started on beta-blocker.  No further episodes of nonsustained ventricular tachycardia in the last 2 days.  Apparently he is followed by a cardiologist in [place].  According to care everywhere he had a stress test in 2017 which showed normal systolic function at that time.  Patient will need outpatient follow-up with cardiology.  History of coronary artery disease He has had stent placement previously.  Followed by cardiologist in [place].  Continue aspirin, Plavix and statin.  Beta-blockers initiated as discussed above.  Remained stable from a cardiac standpoint.  Macrocytic anemia No evidence of overt bleeding. Anemia panel reviewed.  No clear deficiencies identified.  Continue vitamin B12.  Hemoglobin is stable  History of hypothyroidism Continue levothyroxine.  TSH is low most likely due  to sick euthyroid.  Free T4 is normal at 0.96.  History of vitamin B12 deficiency Continue vitamin B12.  History of alcohol abuse with alcohol withdrawal syndrome at the outside hospital No evidence for withdrawal at this time.  Continue thiamine multivitamins folic acid.  Has been  on CIWA protocol.  DVT Prophylaxis: Lovenox PUD Prophylaxis: Protonix Code Status: Full code Family Communication: Discussed with patient.  Disposition Plan: SNF, awaiting bed availability    Medications:  Scheduled: . aspirin EC  81 mg Oral Daily  . atorvastatin  10 mg Oral Daily  . clopidogrel  75 mg Oral Daily  . enoxaparin (LOVENOX) injection  40 mg Subcutaneous BID  . folic acid  1 mg Oral Daily  . Ipratropium-Albuterol  1 puff Inhalation Q6H  . levothyroxine  150 mcg Oral Daily  . metoprolol tartrate  25 mg Oral BID  . multivitamin with minerals  1 tablet Oral Daily  . nicotine  14 mg Transdermal Daily  . nystatin   Topical TID  . pantoprazole  40 mg Oral Daily  . predniSONE  40 mg Oral Q breakfast  . senna  1 tablet Oral BID  . sodium chloride flush  3 mL Intravenous Q12H  . thiamine  100 mg Oral Daily  . traZODone  50 mg Oral QHS  . vitamin B-12  500 mcg Oral Daily  . vitamin C  500 mg Oral Daily  . zinc sulfate  220 mg Oral Daily   Continuous: . sodium chloride Stopped (12/28/18 1858)   RKY:HCWCBJ chloride, acetaminophen, guaiFENesin-dextromethorphan, polyethylene glycol, sodium chloride flush   Objective:  Vital Signs  Vitals:   01/07/19 1510 01/07/19 2100 01/08/19 0400 01/08/19 0739  BP: 104/68 103/69 111/72 103/76  Pulse: 78 80 71   Resp: 17 19 (!) 21 18  Temp: 98.7 F (37.1 C) 98.6 F (37 C) 97.8 F (36.6 C) 97.6 F (36.4 C)  TempSrc: Oral Oral Oral Oral  SpO2: 95% 90% 92%   Weight:      Height:        Intake/Output Summary (Last 24 hours) at 01/08/2019 1148 Last data filed at 01/08/2019 1000 Gross per 24 hour  Intake 480 ml  Output 1375 ml  Net -895 ml   Filed Weights   01/04/19 0529 01/06/19 0500 01/07/19 0521  Weight: 88.2 kg 90.6 kg 89.2 kg    Awake, pleasant, sitting in bed in no apparent distress Good air entry bilaterally Regular rate and rhythm Bowel sounds present, soft abdomen No Cyanosis, Clubbing or edema, No new Rash  or bruise       Lab Results:  Data Reviewed: I have personally reviewed following labs and imaging studies  CBC: Recent Labs  Lab 01/08/19 0435  WBC 12.0*  HGB 12.8*  HCT 38.1*  MCV 99.2  PLT 628    Basic Metabolic Panel: Recent Labs  Lab 01/08/19 0435  NA 135  K 4.1  CL 100  CO2 26  GLUCOSE 66*  BUN 23  CREATININE 1.06  CALCIUM 8.9    GFR: Estimated Creatinine Clearance: 75.9 mL/min (by C-G formula based on SCr of 1.06 mg/dL).  Liver Function Tests: No results for input(s): AST, ALT, ALKPHOS, BILITOT, PROT, ALBUMIN in the last 168 hours.   Anemia Panel: No results for input(s): VITAMINB12, FOLATE, FERRITIN, TIBC, IRON, RETICCTPCT in the last 72 hours.    Radiology Studies: No results found.     LOS: 12 days   Phillips Climes MD  [facility] Pager on  [email or web address]  01/08/2019, 11:48 AM

## 2019-01-08 NOTE — Progress Notes (Signed)
PROGRESS NOTE  Billy Byrd OMV:[id number] DOB: 06/16/1940 DOA: 12/27/2018  PCP: Orpah Cobb., MD  Brief History/Interval Summary:   78 y.o. male with a past medical history of coronary artery disease, status post PCI more than 2 years ago, on aspirin and Plavix, history of hypothyroidism, history of alcohol abuse, history of tobacco abuse who has been living with a sister for the past 1 month.  It is not clear why he moved in with his sister but apparently has been having frequent falls and has not been able to take care of himself, presumably due to his issues with alcoholism.  Patient apparently goes to a "club" where he consumes alcohol.  It is thought that that is where he contracted COVID-19.  He has been sick for about 5 to 6 days.  He was taken to the hospital in [facility] on Saturday and was hospitalized.  He was started on antibacterial agents.  Subsequently the COVID-19 test came back as being positive.  His oxygenation has worsened over the last 24 hours.  He was initially on 2 L of oxygen subsequently went to 6 L and then 10 L this morning.  Chest x-ray was repeated this morning which showed worsening bilateral infiltrates.  He was subsequently transferred over to this facility for further management.   Reason for Visit: Acute respiratory disease due to COVID-19  Consultants: None  Procedures: None  Antibiotics: He was on ceftriaxone and azithromycin at the outside facility.  Not continued.  Subjective/Interval History: Patient denies denies any complaints today, reports he had a good night sleep, no chest pain, no shortness of breath, no nausea or vomiting.   Assessment/Plan:  Acute Hypoxic Resp. Failure due to Acute Covid 19 Viral Illness  COVID-19 Labs  No results for input(s): DDIMER, FERRITIN, LDH, CRP in the last 72 hours.  Fever: Remains afebrile he is currently on room air Antibacterials: None Remdesivir: Completed course Steroids: Initially on  Solu-Medrol.  Changed over to prednisone. Diuretics: Patient takes Lasix on a regular basis at home.  Given intravenous dose on 8/6. Actemra: Not indicated Vitamin C and Zinc: Continue DVT Prophylaxis:  Lovenox 40 mg every 24 hours  Patient's respiratory status continues to improve.  He is currently on room air.  Completed course of Remdesivir , Appetite is improved.  Mentation also appears to be improving.  Continue incentive spirometry mobilization.     Acute metabolic encephalopathy Most likely due to combination of acute illness as well as his history of alcoholism.  Mental status has been improving.  Seems to be close to baseline.  No neurological deficits.    NSVT Noted to have multiple episodes of nonsustained ventricular tachycardia on 8/6.  Patient remained asymptomatic.  EKG did not show any concerning findings. Potassium was supplemented.  Patient was started on beta-blocker.  No further episodes of nonsustained ventricular tachycardia in the last 2 days.  Apparently he is followed by a cardiologist in [place].  According to care everywhere he had a stress test in 2017 which showed normal systolic function at that time.  Patient will need outpatient follow-up with cardiology.  History of coronary artery disease He has had stent placement previously.  Followed by cardiologist in [place].  Continue aspirin, Plavix and statin.  Beta-blockers initiated as discussed above.  Remained stable from a cardiac standpoint.  Macrocytic anemia No evidence of overt bleeding. Anemia panel reviewed.  No clear deficiencies identified.  Continue vitamin B12.  Hemoglobin is stable  History  of hypothyroidism Continue levothyroxine.  TSH is low most likely due to sick euthyroid.  Free T4 is normal at 0.96.  History of vitamin B12 deficiency Continue vitamin B12.  History of alcohol abuse with alcohol withdrawal syndrome at the outside hospital No evidence for withdrawal at this time.  Continue  thiamine multivitamins folic acid.  Has been on CIWA protocol.  DVT Prophylaxis: Lovenox PUD Prophylaxis: Protonix Code Status: Full code Family Communication: Discussed with patient.  Disposition Plan: SNF, awaiting bed availability    Medications:  Scheduled: . aspirin EC  81 mg Oral Daily  . atorvastatin  10 mg Oral Daily  . clopidogrel  75 mg Oral Daily  . enoxaparin (LOVENOX) injection  40 mg Subcutaneous BID  . folic acid  1 mg Oral Daily  . Ipratropium-Albuterol  1 puff Inhalation Q6H  . levothyroxine  150 mcg Oral Daily  . metoprolol tartrate  25 mg Oral BID  . multivitamin with minerals  1 tablet Oral Daily  . nicotine  14 mg Transdermal Daily  . nystatin   Topical TID  . pantoprazole  40 mg Oral Daily  . predniSONE  40 mg Oral Q breakfast  . senna  1 tablet Oral BID  . sodium chloride flush  3 mL Intravenous Q12H  . thiamine  100 mg Oral Daily  . traZODone  50 mg Oral QHS  . vitamin B-12  500 mcg Oral Daily  . vitamin C  500 mg Oral Daily  . zinc sulfate  220 mg Oral Daily   Continuous: . sodium chloride Stopped (12/28/18 1858)   ZTI:WPYKDX chloride, acetaminophen, guaiFENesin-dextromethorphan, polyethylene glycol, sodium chloride flush   Objective:  Vital Signs  Vitals:   01/07/19 1510 01/07/19 2100 01/08/19 0400 01/08/19 0739  BP: 104/68 103/69 111/72 103/76  Pulse: 78 80 71   Resp: 17 19 (!) 21 18  Temp: 98.7 F (37.1 C) 98.6 F (37 C) 97.8 F (36.6 C) 97.6 F (36.4 C)  TempSrc: Oral Oral Oral Oral  SpO2: 95% 90% 92%   Weight:      Height:        Intake/Output Summary (Last 24 hours) at 01/08/2019 1340 Last data filed at 01/08/2019 1000 Gross per 24 hour  Intake 240 ml  Output 1375 ml  Net -1135 ml   Filed Weights   01/04/19 0529 01/06/19 0500 01/07/19 0521  Weight: 88.2 kg 90.6 kg 89.2 kg    Awake, pleasant, sitting in bed eating breakfast Good air entry bilaterally Regular rate and rhythm Abdomen is soft No Cyanosis, Clubbing or  edema, No new Rash or bruise       Lab Results:  Data Reviewed: I have personally reviewed following labs and imaging studies  CBC: Recent Labs  Lab 01/08/19 0435  WBC 12.0*  HGB 12.8*  HCT 38.1*  MCV 99.2  PLT 833    Basic Metabolic Panel: Recent Labs  Lab 01/08/19 0435  NA 135  K 4.1  CL 100  CO2 26  GLUCOSE 66*  BUN 23  CREATININE 1.06  CALCIUM 8.9    GFR: Estimated Creatinine Clearance: 75.9 mL/min (by C-G formula based on SCr of 1.06 mg/dL).  Liver Function Tests: No results for input(s): AST, ALT, ALKPHOS, BILITOT, PROT, ALBUMIN in the last 168 hours.   Anemia Panel: No results for input(s): VITAMINB12, FOLATE, FERRITIN, TIBC, IRON, RETICCTPCT in the last 72 hours.    Radiology Studies: No results found.     LOS: 12 days   [facility]  Levetta Bognar MD  [facility] Pager on [email or web address]  01/08/2019, 1:40 PM
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
## 2019-01-09 MED ORDER — BISACODYL 10 MG RE SUPP
10.0000 mg | Freq: Once | RECTAL | Status: AC
  Administered 2019-01-09: 10 mg via RECTAL
  Filled 2019-01-09: qty 1

## 2019-01-09 MED ORDER — POLYETHYLENE GLYCOL 3350 17 G PO PACK: 17.0000 g | PACK | Freq: Once | ORAL | Status: AC

## 2019-01-09 MED ORDER — BISACODYL 10 MG RE SUPP: 10.0000 mg | Freq: Every day | RECTAL | Status: DC | PRN

## 2019-01-09 NOTE — Progress Notes (Signed)
Occupational Therapy Treatment Patient Details Name: Billy Byrd MRN: [id number] DOB: 08-14-1940 Today's Date: 01/09/2019    History of present illness Pt is a 78 y.o. male admitted 12/27/18 with SOB and worsening oxygenation; COVID-19 positive. CXR with worsening bilateral infiltrates. Pt also with acute metabolic encephalopathy. PMH includes CAD, ETOH/tobacco abuse, OSA.   OT comments  Pt progressing towards established OT goals. Continues to present with decreased balance, safety awareness, and cognition. Pt performing toilet transfer with Min A for safety due to poor control during descent. Pt requiring Min Guard A for safety at sink for hand hygiene. Continue to recommend dc to SNF and will continue to follow acutely as admitted.    Follow Up Recommendations  SNF;Supervision/Assistance - 24 hour    Equipment Recommendations  3 in 1 bedside commode    Recommendations for Other Services      Precautions / Restrictions Precautions Precautions: Fall Restrictions Weight Bearing Restrictions: No       Mobility Bed Mobility Overal bed mobility: Needs Assistance Bed Mobility: Supine to Sit     Supine to sit: Supervision;HOB elevated     General bed mobility comments: Supervision for safety  Transfers Overall transfer level: Needs assistance Equipment used: Rolling walker (2 wheeled) Transfers: Sit to/from Stand Sit to Stand: Min guard         General transfer comment: Min Guard A for safety.     Balance Overall balance assessment: Needs assistance Sitting-balance support: Feet supported Sitting balance-Leahy Scale: Good     Standing balance support: No upper extremity supported;During functional activity Standing balance-Leahy Scale: Poor Standing balance comment: requires UE support                           ADL either performed or assessed with clinical judgement   ADL Overall ADL's : Needs assistance/impaired     Grooming: Wash/dry hands;Min  guard;Standing Grooming Details (indicate cue type and reason): Min Guard A for safety while at sink                 Toilet Transfer: Minimal Agricultural engineer Details (indicate cue type and reason): Poor awareness and falling back on the toilet; requiring Min A for safe descent Toileting- Water quality scientist and Hygiene: Supervision/safety;Sitting/lateral lean Toileting - Clothing Manipulation Details (indicate cue type and reason): Supervision for safety     Functional mobility during ADLs: Minimal assistance;Rolling walker;Cueing for safety;Min guard General ADL Comments: Pt continues to present with decreased balance and awareness     Vision       Perception     Praxis      Cognition Arousal/Alertness: Awake/alert Behavior During Therapy: WFL for tasks assessed/performed Overall Cognitive Status: No family/caregiver present to determine baseline cognitive functioning Area of Impairment: Attention;Memory;Safety/judgement;Awareness;Problem solving;Following commands                   Current Attention Level: Sustained Memory: Decreased recall of precautions;Decreased short-term memory Following Commands: Follows one step commands with increased time Safety/Judgement: Decreased awareness of safety Awareness: Emergent Problem Solving: Slow processing;Requires verbal cues General Comments: Pt oriented to self and situation. When asking if pt would like to walk in the hallwaY, he said "oh no. you don't know if they are positive or negative out there." When asked to explain, pt able to state that he didn't want to get sick again. Pt continues to present with poor safety awareness as seen by RW management during ADLs; pt  will move RW to side and attempt to step around it. Pt also presenting with ST memory deficits repeating certain topics already discussed        Exercises     Shoulder Instructions       General Comments  SpO2 90s on RA. HR 60-80s. RR 16-24    Pertinent Vitals/ Pain       Pain Assessment: No/denies pain  Home Living                                          Prior Functioning/Environment              Frequency  Min 3X/week        Progress Toward Goals  OT Goals(current goals can now be found in the care plan section)  Progress towards OT goals: Progressing toward goals  Acute Rehab OT Goals Patient Stated Goal: to go home OT Goal Formulation: With patient Time For Goal Achievement: 01/11/19 Potential to Achieve Goals: Good ADL Goals Pt Will Perform Grooming: standing;with supervision Pt Will Perform Lower Body Bathing: sit to/from stand;with supervision Pt Will Perform Upper Body Dressing: with modified independence;sitting Pt Will Perform Lower Body Dressing: sit to/from stand;with supervision Pt Will Transfer to Toilet: ambulating;with supervision Pt Will Perform Toileting - Clothing Manipulation and hygiene: with supervision;sit to/from stand Pt/caregiver will Perform Home Exercise Program: Increased strength;Both right and left upper extremity;With theraband;With written HEP provided Additional ADL Goal #1: Pt will demonstrate anticipatory awareness during functional task.  Plan Discharge plan remains appropriate    Co-evaluation                 AM-PAC OT "6 Clicks" Daily Activity     Outcome Measure   Help from another person eating meals?: A Little Help from another person taking care of personal grooming?: A Little Help from another person toileting, which includes using toliet, bedpan, or urinal?: A Little Help from another person bathing (including washing, rinsing, drying)?: A Little Help from another person to put on and taking off regular upper body clothing?: A Little Help from another person to put on and taking off regular lower body clothing?: A Little 6 Click Score: 18    End of Session Equipment Utilized During  Treatment: Rolling walker  OT Visit Diagnosis: Unsteadiness on feet (R26.81);Other symptoms and signs involving cognitive function;Muscle weakness (generalized) (M62.81)   Activity Tolerance Patient tolerated treatment well   Patient Left in chair;with call bell/phone within reach;with chair alarm set   Nurse Communication Mobility status        Time: 4431-5400 OT Time Calculation (min): 28 min  Charges: OT General Charges $OT Visit: 1 Visit OT Treatments $Self Care/Home Management : 23-37 mins  [place], OTR/L Acute Rehab Pager: [phone] Office: [place] 01/09/2019, 2:30 PM

## 2019-01-09 NOTE — Progress Notes (Signed)
Patient's brother Billy Byrd called to get information about how to get patient to sign paperwork from realtor. Called and spoke to [facility] with infection prevention about this.  They recommended that we use a sleeve to put paperwork in and cut out where patient needs to sign.  Realtor [name] Caller to drop paperwork off at 4 pm tomorrow.

## 2019-01-09 NOTE — Progress Notes (Signed)
OT Cancellation Note  Patient Details Name: Ceylon Arenson MRN: [id number] DOB: May 10, 1941   Cancelled Treatment:    Reason Eval/Treat Not Completed: Other (comment)(Just finished grooming and bathing with RN. Requesting to rest in bed before lunch. Will return as schedule allows. Thank you.)  [place], OTR/L Acute Rehab Pager: [phone] Office: [phone] 01/09/2019, 11:05 AM

## 2019-01-09 NOTE — Progress Notes (Signed)
PROGRESS NOTE  Billy Byrd PJS:[id number] DOB: March 03, 1941 DOA: 12/27/2018  PCP: Orpah Cobb., MD  Brief History/Interval Summary:   78 y.o. male with a past medical history of coronary artery disease, status post PCI more than 2 years ago, on aspirin and Plavix, history of hypothyroidism, history of alcohol abuse, history of tobacco abuse who has been living with a sister for the past 1 month.  It is not clear why he moved in with his sister but apparently has been having frequent falls and has not been able to take care of himself, presumably due to his issues with alcoholism.  Patient apparently goes to a "club" where he consumes alcohol.  It is thought that that is where he contracted COVID-19.  He has been sick for about 5 to 6 days.  He was taken to the hospital in [facility] on Saturday and was hospitalized.  He was started on antibacterial agents.  Subsequently the COVID-19 test came back as being positive.  His oxygenation has worsened over the last 24 hours.  He was initially on 2 L of oxygen subsequently went to 6 L and then 10 L this morning.  Chest x-ray was repeated this morning which showed worsening bilateral infiltrates.  He was subsequently transferred over to this facility for further management.   Reason for Visit: Acute respiratory disease due to COVID-19  Consultants: None  Procedures: None  Antibiotics: He was on ceftriaxone and azithromycin at the outside facility.  Not continued.  Subjective/Interval History: Patient denies denies nausea or vomiting, no shortness of breath or abdominal pain, he does report some constipation.  Assessment/Plan:  Acute Hypoxic Resp. Failure due to Acute Covid 19 Viral Illness  COVID-19 Labs  No results for input(s): DDIMER, FERRITIN, LDH, CRP in the last 72 hours.  Fever: Remains afebrile he is currently on room air Antibacterials: None Remdesivir: Completed course Steroids: Initially on Solu-Medrol.  Changed  over to prednisone. Diuretics: Patient takes Lasix on a regular basis at home.  Given intravenous dose on 8/6. Actemra: Not indicated Vitamin C and Zinc: Continue DVT Prophylaxis:  Lovenox 40 mg every 24 hours  Patient's respiratory status continues to improve.  He is currently on room air.  Completed course of Remdesivir , Appetite is improved.  Mentation also appears to be improving.  Continue incentive spirometry mobilization.     Acute metabolic encephalopathy Most likely due to combination of acute illness as well as his history of alcoholism.  Mental status has been improving.  Seems to be close to baseline.  No neurological deficits.    NSVT Noted to have multiple episodes of nonsustained ventricular tachycardia on 8/6.  Patient remained asymptomatic.  EKG did not show any concerning findings. Potassium was supplemented.  Patient was started on beta-blocker.  No further episodes of nonsustained ventricular tachycardia in the last 2 days.  Apparently he is followed by a cardiologist in [place].  According to care everywhere he had a stress test in 2017 which showed normal systolic function at that time.  Patient will need outpatient follow-up with cardiology.  History of coronary artery disease He has had stent placement previously.  Followed by cardiologist in [place].  Continue aspirin, Plavix and statin.  Beta-blockers initiated as discussed above.  Remained stable from a cardiac standpoint.  Macrocytic anemia No evidence of overt bleeding. Anemia panel reviewed.  No clear deficiencies identified.  Continue vitamin B12.  Hemoglobin is stable  History of hypothyroidism Continue levothyroxine.  TSH is  low most likely due to sick euthyroid.  Free T4 is normal at 0.96.  History of vitamin B12 deficiency Continue vitamin B12.  History of alcohol abuse with alcohol withdrawal syndrome at the outside hospital No evidence for withdrawal at this time.  Continue thiamine multivitamins  folic acid.  Has been on CIWA protocol.  DVT Prophylaxis: Lovenox PUD Prophylaxis: Protonix Code Status: Full code  Disposition Plan: SNF, awaiting bed availability and PASSAR    Medications:  Scheduled: . aspirin EC  81 mg Oral Daily  . atorvastatin  10 mg Oral Daily  . bisacodyl  10 mg Rectal Once  . clopidogrel  75 mg Oral Daily  . enoxaparin (LOVENOX) injection  40 mg Subcutaneous BID  . folic acid  1 mg Oral Daily  . Ipratropium-Albuterol  1 puff Inhalation Q6H  . levothyroxine  150 mcg Oral Daily  . metoprolol tartrate  25 mg Oral BID  . multivitamin with minerals  1 tablet Oral Daily  . nicotine  14 mg Transdermal Daily  . nystatin   Topical TID  . pantoprazole  40 mg Oral Daily  . polyethylene glycol  17 g Oral Once  . predniSONE  40 mg Oral Q breakfast  . senna  1 tablet Oral BID  . sodium chloride flush  3 mL Intravenous Q12H  . thiamine  100 mg Oral Daily  . traZODone  50 mg Oral QHS  . vitamin B-12  500 mcg Oral Daily  . vitamin C  500 mg Oral Daily  . zinc sulfate  220 mg Oral Daily   Continuous: . sodium chloride Stopped (12/28/18 1858)   VQQ:VZDGLO chloride, acetaminophen, bisacodyl, guaiFENesin-dextromethorphan, polyethylene glycol, sodium chloride flush   Objective:  Vital Signs  Vitals:   01/09/19 0431 01/09/19 0552 01/09/19 0717 01/09/19 1200  BP:  102/72 109/76 108/67  Pulse:  69 69 72  Resp:  12 12 14   Temp:  98.2 F (36.8 C) 97.7 F (36.5 C) 97.8 F (36.6 C)  TempSrc:   Oral Oral  SpO2:  95% 96% 94%  Weight: 89 kg     Height:        Intake/Output Summary (Last 24 hours) at 01/09/2019 1427 Last data filed at 01/09/2019 1200 Gross per 24 hour  Intake 363 ml  Output 300 ml  Net 63 ml   Filed Weights   01/06/19 0500 01/07/19 0521 01/09/19 0431  Weight: 90.6 kg 89.2 kg 89 kg    Awake, pleasant, sitting in bed eating breakfast Good air entry bilaterally Good rate and rhythm Soft abdomen, bowel sounds present No Cyanosis,  Clubbing or edema, No new Rash or bruise       Lab Results:  Data Reviewed: I have personally reviewed following labs and imaging studies  CBC: Recent Labs  Lab 01/08/19 0435  WBC 12.0*  HGB 12.8*  HCT 38.1*  MCV 99.2  PLT 756    Basic Metabolic Panel: Recent Labs  Lab 01/08/19 0435  NA 135  K 4.1  CL 100  CO2 26  GLUCOSE 66*  BUN 23  CREATININE 1.06  CALCIUM 8.9    GFR: Estimated Creatinine Clearance: 75.8 mL/min (by C-G formula based on SCr of 1.06 mg/dL).  Liver Function Tests: No results for input(s): AST, ALT, ALKPHOS, BILITOT, PROT, ALBUMIN in the last 168 hours.   Anemia Panel: No results for input(s): VITAMINB12, FOLATE, FERRITIN, TIBC, IRON, RETICCTPCT in the last 72 hours.    Radiology Studies: No results found.  LOS: 63 days   Phillips Climes MD  [facility] Pager on [email or web address]  01/09/2019, 2:27 PM

## 2019-01-09 NOTE — Progress Notes (Signed)
Telephone call to patient's son, Fernando Stoiber, updated on plan of care and answered all questions.

## 2019-01-10 NOTE — Progress Notes (Signed)
PROGRESS NOTE  Billy Byrd EXN:[id number] DOB: 1940-09-29 DOA: 12/27/2018  PCP: Orpah Cobb., MD  Brief History/Interval Summary:   78 y.o. male with a past medical history of coronary artery disease, status post PCI more than 2 years ago, on aspirin and Plavix, history of hypothyroidism, history of alcohol abuse, history of tobacco abuse who has been living with a sister for the past 1 month.  It is not clear why he moved in with his sister but apparently has been having frequent falls and has not been able to take care of himself, presumably due to his issues with alcoholism.  Patient apparently goes to a "club" where he consumes alcohol.  It is thought that that is where he contracted COVID-19.  He has been sick for about 5 to 6 days.  He was taken to the hospital in [facility] on Saturday and was hospitalized.  He was started on antibacterial agents.  Subsequently the COVID-19 test came back as being positive.  His oxygenation has worsened over the last 24 hours.  He was initially on 2 L of oxygen subsequently went to 6 L and then 10 L this morning.  Chest x-ray was repeated this morning which showed worsening bilateral infiltrates.  He was subsequently transferred over to this facility for further management.   Reason for Visit: Acute respiratory disease due to COVID-19  Consultants: None  Procedures: None  Antibiotics: He was on ceftriaxone and azithromycin at the outside facility.  Not continued.  Subjective/Interval History: Patient denies denies nausea or vomiting, no shortness of breath or abdominal pain.  Assessment/Plan:  Acute Hypoxic Resp. Failure due to Acute Covid 19 Viral Illness  COVID-19 Labs  No results for input(s): DDIMER, FERRITIN, LDH, CRP in the last 72 hours.  Fever: Remains afebrile he is currently on room air Antibacterials: None Remdesivir: Completed course Steroids: Initially on Solu-Medrol.  Changed over to prednisone. Diuretics:  Patient takes Lasix on a regular basis at home.  Given intravenous dose on 8/6. Actemra: Not indicated Vitamin C and Zinc: Continue DVT Prophylaxis:  Lovenox 40 mg every 24 hours  Patient's respiratory status continues to improve.  He is currently on room air.  Completed course of Remdesivir , Appetite is improved.  Mentation also appears to be improving.  Continue incentive spirometry mobilization.     Acute metabolic encephalopathy Most likely due to combination of acute illness as well as his history of alcoholism.  Mental status has been improving.  Seems to be close to baseline.  No neurological deficits.    NSVT Noted to have multiple episodes of nonsustained ventricular tachycardia on 8/6.  Patient remained asymptomatic.  EKG did not show any concerning findings. Potassium was supplemented.  Patient was started on beta-blocker.  No further episodes of nonsustained ventricular tachycardia in the last 2 days.  Apparently he is followed by a cardiologist in [place].  According to care everywhere he had a stress test in 2017 which showed normal systolic function at that time.  Patient will need outpatient follow-up with cardiology.  History of coronary artery disease He has had stent placement previously.  Followed by cardiologist in [place].  Continue aspirin, Plavix and statin.  Beta-blockers initiated as discussed above.  Remained stable from a cardiac standpoint.  Macrocytic anemia No evidence of overt bleeding. Anemia panel reviewed.  No clear deficiencies identified.  Continue vitamin B12.  Hemoglobin is stable  History of hypothyroidism Continue levothyroxine.  TSH is low most likely due to  sick euthyroid.  Free T4 is normal at 0.96.  History of vitamin B12 deficiency Continue vitamin B12.  History of alcohol abuse with alcohol withdrawal syndrome at the outside hospital No evidence for withdrawal at this time.  Continue thiamine multivitamins folic acid.  Has been on CIWA  protocol.  DVT Prophylaxis: Lovenox PUD Prophylaxis: Protonix Code Status: Full code  Disposition Plan: SNF, awaiting bed availability and PASSAR    Medications:  Scheduled: . aspirin EC  81 mg Oral Daily  . atorvastatin  10 mg Oral Daily  . clopidogrel  75 mg Oral Daily  . enoxaparin (LOVENOX) injection  40 mg Subcutaneous BID  . folic acid  1 mg Oral Daily  . Ipratropium-Albuterol  1 puff Inhalation Q6H  . levothyroxine  150 mcg Oral Daily  . metoprolol tartrate  25 mg Oral BID  . multivitamin with minerals  1 tablet Oral Daily  . nicotine  14 mg Transdermal Daily  . nystatin   Topical TID  . pantoprazole  40 mg Oral Daily  . predniSONE  40 mg Oral Q breakfast  . senna  1 tablet Oral BID  . sodium chloride flush  3 mL Intravenous Q12H  . thiamine  100 mg Oral Daily  . traZODone  50 mg Oral QHS  . vitamin B-12  500 mcg Oral Daily  . vitamin C  500 mg Oral Daily  . zinc sulfate  220 mg Oral Daily   Continuous: . sodium chloride Stopped (12/28/18 1858)   HUD:JSHFWY chloride, acetaminophen, bisacodyl, guaiFENesin-dextromethorphan, polyethylene glycol, sodium chloride flush   Objective:  Vital Signs  Vitals:   01/10/19 0435 01/10/19 0549 01/10/19 0814 01/10/19 1544  BP:  104/69 106/74 115/76  Pulse:  76 74 82  Resp:   12 15  Temp:   97.6 F (36.4 C) 98.4 F (36.9 C)  TempSrc:   Oral Oral  SpO2:   96% 98%  Weight: 89.2 kg     Height:        Intake/Output Summary (Last 24 hours) at 01/10/2019 1556 Last data filed at 01/10/2019 1546 Gross per 24 hour  Intake 720 ml  Output 500 ml  Net 220 ml   Filed Weights   01/07/19 0521 01/09/19 0431 01/10/19 0435  Weight: 89.2 kg 89 kg 89.2 kg    Awake, pleasant, no apparent distress Good air entry bilaterally Regular rate and rhythm Soft abdomen, bowel sounds present No Cyanosis, Clubbing or edema, No new Rash or bruise       Lab Results:  Data Reviewed: I have personally reviewed following labs and imaging  studies  CBC: Recent Labs  Lab 01/08/19 0435  WBC 12.0*  HGB 12.8*  HCT 38.1*  MCV 99.2  PLT 637    Basic Metabolic Panel: Recent Labs  Lab 01/08/19 0435  NA 135  K 4.1  CL 100  CO2 26  GLUCOSE 66*  BUN 23  CREATININE 1.06  CALCIUM 8.9    GFR: Estimated Creatinine Clearance: 75.9 mL/min (by C-G formula based on SCr of 1.06 mg/dL).  Liver Function Tests: No results for input(s): AST, ALT, ALKPHOS, BILITOT, PROT, ALBUMIN in the last 168 hours.   Anemia Panel: No results for input(s): VITAMINB12, FOLATE, FERRITIN, TIBC, IRON, RETICCTPCT in the last 72 hours.    Radiology Studies: No results found.     LOS: 70 days   Phillips Climes MD  [facility] Pager on [email or web address]  01/10/2019, 3:56 PM

## 2019-01-10 NOTE — Progress Notes (Signed)
Updated pt's brother Lynann Bologna on his brothers status and plan of care.

## 2019-01-11 DIAGNOSIS — J1289 Other viral pneumonia: Secondary | ICD-10-CM

## 2019-01-11 DIAGNOSIS — E038 Other specified hypothyroidism: Secondary | ICD-10-CM

## 2019-01-11 MED ORDER — PREDNISONE 20 MG PO TABS
20.0000 mg | ORAL_TABLET | Freq: Every day | ORAL | Status: DC
  Administered 2019-01-12 – 2019-01-13 (×2): 20 mg via ORAL
  Filled 2019-01-11 (×2): qty 1

## 2019-01-11 NOTE — Progress Notes (Signed)
Phoned the Pt's contact, Billy Byrd, he was not available, left message that I was calling from [facility], that all was fine, and someone would be calling tonight.

## 2019-01-11 NOTE — Progress Notes (Signed)
PROGRESS NOTE    Billy Byrd  LKG:[id number] DOB: [birth year] DOA: 12/27/2018 PCP: Orpah Cobb., MD   Brief Narrative:  78 y.o.WM PMHx coronary artery disease, status post PCI more than 2 years ago, on aspirin and Plavix, history of hypothyroidism, history of alcohol abuse, history of tobacco abuse who has been living with a sister for the past 1 month. It is not clear why he moved in with hissister but apparently has been having frequent falls and has not been able to take care of himself,presumably due to his issues with alcoholism. Patient apparently goes to a"club" where he consumes alcohol. It is thought that that iswhere he contracted COVID-19. He has been sick for about 5 to 6 days. He was taken to the hospital in [facility] on Saturday and was hospitalized. He was started on antibacterial agents. Subsequently the COVID-19 test came back as being positive. His oxygenation has worsened over the last 24 hours. He was initially on 2 L of oxygen subsequently went to 6 L and then 10 L this morning. Chest x-ray was repeated this morning which showed worsening bilateral infiltrates. He was subsequently transferred over to this facility for further management.   Subjective: 8/28/  A/O x4, negative S OB, negative abdominal pain, negative and/V.  States was told he requires a new COVID test for placement but has not had one.   Assessment & Plan:   Principal Problem:   Acute respiratory disease due to COVID-19 virus Active Problems:   Acute respiratory failure with hypoxia (HCC)   Acute metabolic encephalopathy   Alcohol abuse   CAD (coronary artery disease)   Hypothyroidism   Acute respiratory failure with hypoxia/COVID 19 pneumonia  - 8/20 SNF COVID test pending - 8/20 reconsulted social work; plan of care?  SNF placement? -  Afebrile on RA. - Ambulatory SPO2 pending - Prednisone 40 mg daily aggressively titrate off; 8/20 decrease prednisone 20 mg daily -  Completed course Remdesivir - Actemra never indicated - Continue COVID vitamins per protocol -Combivent QID  Acute metabolic encephalopathy -Most likely secondary to acute illness + EtOH abuse -Resolved  Nonsustained ventricular tachycardia -Noted to have multiple episodes of nonsustained ventricular tachycardia on 8/6.  Patient remained asymptomatic.  EKG did not show any concerning findings. Potassium was supplemented.  Patient was started on beta-blocker.  No further episodes of nonsustained ventricular tachycardia in the last 2 days.  Apparently he is followed by a cardiologist in [place].  According to care everywhere he had a stress test in 2017 which showed normal systolic function at that time.   -Patient will need outpatient follow-up with cardiology. -Metoprolol 25 mg BID -Currently NSR   CAD  -He has had stent placement previously.   -Followed by cardiologist in [place].  Continue aspirin, Plavix and statin.    Macrocytic anemia  -No evidence of overt bleeding. Anemia panel reviewed.  No clear deficiencies identified.   -Continue vitamin B12.  Hemoglobin is stable  B12 deficiency -Seem macrocytic anemia  Hypothyroidism  -Synthroid 150 mcg daily  - TSH is low most likely due to sick euthyroid.  Free T4 is normal at 0.96.  EtOH abuse with withdrawal at outside hospital  -Currently no signs or symptoms of withdrawal    DVT prophylaxis: Lovenox Code Status: Full Family Communication: None Disposition Plan: TBD.  Awaiting placement   Consultants:    Procedures/Significant Events:     I have personally reviewed and interpreted all radiology studies and my findings are  as above.  VENTILATOR SETTINGS:    Cultures 8/20 SNF COVID pending   Antimicrobials: Anti-infectives (From admission, onward)   Start     Stop   12/28/18 1800  remdesivir 100 mg in sodium chloride 0.9 % 250 mL IVPB     12/31/18 2100   12/27/18 1800  remdesivir 200 mg in sodium  chloride 0.9 % 250 mL IVPB     12/27/18 2144       Devices    LINES / TUBES:      Continuous Infusions: . sodium chloride Stopped (12/28/18 1858)     Objective: Vitals:   01/10/19 2123 01/10/19 2333 01/11/19 0419 01/11/19 0500  BP: 102/66 115/62 110/81   Pulse: 77 72 76   Resp:   16   Temp:   97.6 F (36.4 C)   TempSrc:   Oral   SpO2:   96%   Weight:    89 kg  Height:        Intake/Output Summary (Last 24 hours) at 01/11/2019 0909 Last data filed at 01/10/2019 1830 Gross per 24 hour  Intake 840 ml  Output 425 ml  Net 415 ml   Filed Weights   01/09/19 0431 01/10/19 0435 01/11/19 0500  Weight: 89 kg 89.2 kg 89 kg    Examination:  General: No acute respiratory distress Eyes: negative scleral hemorrhage, negative anisocoria, negative icterus ENT: Negative Runny nose, negative gingival bleeding, Neck:  Negative scars, masses, torticollis, lymphadenopathy, JVD Lungs: Clear to auscultation bilaterally without wheezes or crackles Cardiovascular: Regular rate and rhythm without murmur gallop or rub normal S1 and S2 Abdomen: negative abdominal pain, nondistended, positive soft, bowel sounds, no rebound, no ascites, no appreciable mass Extremities: No significant cyanosis, clubbing, or edema bilateral lower extremities Skin: Negative rashes, lesions, ulcers Psychiatric:  Negative depression, negative anxiety, negative fatigue, negative mania  Central nervous system:  Cranial nerves II through XII intact, tongue/uvula midline, all extremities muscle strength 5/5, sensation intact throughout, , negative dysarthria, negative expressive aphasia, negative receptive aphasia.  .     Data Reviewed: Care during the described time interval was provided by me .  I have reviewed this patient's available data, including medical history, events of note, physical examination, and all test results as part of my evaluation.   CBC: Recent Labs  Lab 01/08/19 0435  WBC 12.0*  HGB  12.8*  HCT 38.1*  MCV 99.2  PLT 419   Basic Metabolic Panel: Recent Labs  Lab 01/08/19 0435  NA 135  K 4.1  CL 100  CO2 26  GLUCOSE 66*  BUN 23  CREATININE 1.06  CALCIUM 8.9   GFR: Estimated Creatinine Clearance: 75.8 mL/min (by C-G formula based on SCr of 1.06 mg/dL). Liver Function Tests: No results for input(s): AST, ALT, ALKPHOS, BILITOT, PROT, ALBUMIN in the last 168 hours. No results for input(s): LIPASE, AMYLASE in the last 168 hours. No results for input(s): AMMONIA in the last 168 hours. Coagulation Profile: No results for input(s): INR, PROTIME in the last 168 hours. Cardiac Enzymes: No results for input(s): CKTOTAL, CKMB, CKMBINDEX, TROPONINI in the last 168 hours. BNP (last 3 results) No results for input(s): PROBNP in the last 8760 hours. HbA1C: No results for input(s): HGBA1C in the last 72 hours. CBG: No results for input(s): GLUCAP in the last 168 hours. Lipid Profile: No results for input(s): CHOL, HDL, LDLCALC, TRIG, CHOLHDL, LDLDIRECT in the last 72 hours. Thyroid Function Tests: No results for input(s): TSH, T4TOTAL, FREET4, T3FREE, THYROIDAB  in the last 72 hours. Anemia Panel: No results for input(s): VITAMINB12, FOLATE, FERRITIN, TIBC, IRON, RETICCTPCT in the last 72 hours. Urine analysis:    Component Value Date/Time   COLORURINE YELLOW 01/07/2014 1226   APPEARANCEUR CLEAR 01/07/2014 1226   LABSPEC 1.021 01/07/2014 1226   PHURINE 7.0 01/07/2014 1226   GLUCOSEU NEGATIVE 01/07/2014 1226   HGBUR NEGATIVE 01/07/2014 1226   BILIRUBINUR NEGATIVE 01/07/2014 1226   KETONESUR 15 (A) 01/07/2014 1226   PROTEINUR NEGATIVE 01/07/2014 1226   UROBILINOGEN 0.2 01/07/2014 1226   NITRITE NEGATIVE 01/07/2014 1226   LEUKOCYTESUR TRACE (A) 01/07/2014 1226   Sepsis Labs: @LABRCNTIP (procalcitonin:4,lacticidven:4)  ) Recent Results (from the past 240 hour(s))  SARS Coronavirus 2 [facility] order, Performed in [place] hospital lab)     Status: Abnormal    Collection Time: 01/04/19  2:00 PM  Result Value Ref Range Status   SARS Coronavirus 2 POSITIVE (A) NEGATIVE Final    Comment: RESULT CALLED TO, READ BACK BY AND VERIFIED WITH: E.DODOO AT 2025 ON 01/04/19 BY N.THOMPSON (NOTE) If result is NEGATIVE SARS-CoV-2 target nucleic acids are NOT DETECTED. The SARS-CoV-2 RNA is generally detectable in upper and lower  respiratory specimens during the acute phase of infection. The lowest  concentration of SARS-CoV-2 viral copies this assay can detect is 250  copies / mL. A negative result does not preclude SARS-CoV-2 infection  and should not be used as the sole basis for treatment or other  patient management decisions.  A negative result may occur with  improper specimen collection / handling, submission of specimen other  than nasopharyngeal swab, presence of viral mutation(s) within the  areas targeted by this assay, and inadequate number of viral copies  (<250 copies / mL). A negative result must be combined with clinical  observations, patient history, and epidemiological information. If result is POSITIVE SARS-CoV-2 target nucleic acids are DETECTE D. The SARS-CoV-2 RNA is generally detectable in upper and lower  respiratory specimens during the acute phase of infection.  Positive  results are indicative of active infection with SARS-CoV-2.  Clinical  correlation with patient history and other diagnostic information is  necessary to determine patient infection status.  Positive results do  not rule out bacterial infection or co-infection with other viruses. If result is PRESUMPTIVE POSTIVE SARS-CoV-2 nucleic acids MAY BE PRESENT.   A presumptive positive result was obtained on the submitted specimen  and confirmed on repeat testing.  While 2019 novel coronavirus  (SARS-CoV-2) nucleic acids may be present in the submitted sample  additional confirmatory testing may be necessary for epidemiological  and / or clinical management purposes   to differentiate between  SARS-CoV-2 and other Sarbecovirus currently known to infect humans.  If clinically indicated additional testing with an alternate test  methodology (LAB745 3) is advised. The SARS-CoV-2 RNA is generally  detectable in upper and lower respiratory specimens during the acute  phase of infection. The expected result is Negative. Fact Sheet for Patients:  StrictlyIdeas.no Fact Sheet for Healthcare Providers: BankingDealers.co.za This test is not yet approved or cleared by the [place] FDA and has been authorized for detection and/or diagnosis of SARS-CoV-2 by FDA under an Emergency Use Authorization (EUA).  This EUA will remain in effect (meaning this test can be used) for the duration of the COVID-19 declaration under Section 564(b)(1) of the Act, 21 U.S.C. section 360bbb-3(b)(1), unless the authorization is terminated or revoked sooner. Performed at [facility], [place] [address] [place], [place] [address]  Radiology Studies: No results found.      Scheduled Meds: . aspirin EC  81 mg Oral Daily  . atorvastatin  10 mg Oral Daily  . clopidogrel  75 mg Oral Daily  . enoxaparin (LOVENOX) injection  40 mg Subcutaneous BID  . folic acid  1 mg Oral Daily  . Ipratropium-Albuterol  1 puff Inhalation Q6H  . levothyroxine  150 mcg Oral Daily  . metoprolol tartrate  25 mg Oral BID  . multivitamin with minerals  1 tablet Oral Daily  . nicotine  14 mg Transdermal Daily  . nystatin   Topical TID  . pantoprazole  40 mg Oral Daily  . predniSONE  40 mg Oral Q breakfast  . senna  1 tablet Oral BID  . sodium chloride flush  3 mL Intravenous Q12H  . thiamine  100 mg Oral Daily  . traZODone  50 mg Oral QHS  . vitamin B-12  500 mcg Oral Daily  . vitamin C  500 mg Oral Daily  . zinc sulfate  220 mg Oral Daily   Continuous Infusions: . sodium chloride Stopped (12/28/18 1858)     LOS:  15 days   The patient is critically ill with multiple organ systems failure and requires high complexity decision making for assessment and support, frequent evaluation and titration of therapies, application of advanced monitoring technologies and extensive interpretation of multiple databases. Critical Care Time devoted to patient care services described in this note  Time spent: 40 minutes     Opaline Reyburn, Geraldo Docker, MD [facility] Pager [phone]  If 7PM-7AM, please contact night-coverage [email or web address] Password [email or web address] 01/11/2019, 9:09 AM

## 2019-01-11 NOTE — Progress Notes (Signed)
Pt ambulated 29ft with front wheel walker and standby assist.  Pt's oxygen saturation remained at 95% on room air.

## 2019-01-11 NOTE — Plan of Care (Signed)
  Problem: Education: Goal: Knowledge of General Education information will improve Description: Including pain rating scale, medication(s)/side effects and non-pharmacologic comfort measures Outcome: Progressing   Problem: Health Behavior/Discharge Planning: Goal: Ability to manage health-related needs will improve Outcome: Progressing   Problem: Clinical Measurements: Goal: Ability to maintain clinical measurements within normal limits will improve Outcome: Progressing Goal: Will remain free from infection Outcome: Progressing Goal: Diagnostic test results will improve Outcome: Progressing   Problem: Activity: Goal: Risk for activity intolerance will decrease Outcome: Progressing   Problem: Safety: Goal: Ability to remain free from injury will improve Outcome: Progressing   Problem: Skin Integrity: Goal: Risk for impaired skin integrity will decrease Outcome: Progressing   Problem: Education: Goal: Knowledge of risk factors and measures for prevention of condition will improve Outcome: Progressing   Problem: Coping: Goal: Psychosocial and spiritual needs will be supported Outcome: Progressing   Problem: Respiratory: Goal: Will maintain a patent airway Outcome: Progressing Goal: Complications related to the disease process, condition or treatment will be avoided or minimized Outcome: Progressing   Problem: Education: Goal: Knowledge of General Education information will improve Description: Including pain rating scale, medication(s)/side effects and non-pharmacologic comfort measures Outcome: Progressing   Problem: Health Behavior/Discharge Planning: Goal: Ability to manage health-related needs will improve Outcome: Progressing   Problem: Clinical Measurements: Goal: Ability to maintain clinical measurements within normal limits will improve Outcome: Progressing Goal: Will remain free from infection Outcome: Progressing Goal: Diagnostic test results will  improve Outcome: Progressing Goal: Respiratory complications will improve Outcome: Progressing Goal: Cardiovascular complication will be avoided Outcome: Progressing   Problem: Activity: Goal: Risk for activity intolerance will decrease Outcome: Progressing   Problem: Nutrition: Goal: Adequate nutrition will be maintained Outcome: Progressing   Problem: Coping: Goal: Level of anxiety will decrease Outcome: Progressing   Problem: Elimination: Goal: Will not experience complications related to bowel motility Outcome: Progressing Goal: Will not experience complications related to urinary retention Outcome: Progressing   Problem: Pain Managment: Goal: General experience of comfort will improve Outcome: Progressing   Problem: Safety: Goal: Ability to remain free from injury will improve Outcome: Progressing   Problem: Skin Integrity: Goal: Risk for impaired skin integrity will decrease Outcome: Progressing   Problem: Education: Goal: Knowledge of risk factors and measures for prevention of condition will improve Outcome: Progressing   Problem: Coping: Goal: Psychosocial and spiritual needs will be supported Outcome: Progressing   Problem: Respiratory: Goal: Will maintain a patent airway Outcome: Progressing Goal: Complications related to the disease process, condition or treatment will be avoided or minimized Outcome: Progressing

## 2019-01-12 NOTE — Care Management (Signed)
Case manager has sent FL2 and DC summary to [facility] and [employer] for review.   Ricki Miller, RN BSN Case Manager [phone]

## 2019-01-12 NOTE — Progress Notes (Signed)
Brother, Alvester Chou updated

## 2019-01-12 NOTE — Progress Notes (Addendum)
PROGRESS NOTE    Billy Byrd  [id number] DOB: Mar 16, 1941 DOA: 12/27/2018 PCP: Orpah Cobb., MD   Brief Narrative:  78 y.o.WM PMHx coronary artery disease, status post PCI more than 2 years ago, on aspirin and Plavix, history of hypothyroidism, history of alcohol abuse, history of tobacco abuse who has been living with a sister for the past 1 month. It is not clear why he moved in with hissister but apparently has been having frequent falls and has not been able to take care of himself,presumably due to his issues with alcoholism. Patient apparently goes to a"club" where he consumes alcohol. It is thought that that iswhere he contracted COVID-19. He has been sick for about 5 to 6 days. He was taken to the hospital in [facility] on Saturday and was hospitalized. He was started on antibacterial agents. Subsequently the COVID-19 test came back as being positive. His oxygenation has worsened over the last 24 hours. He was initially on 2 L of oxygen subsequently went to 6 L and then 10 L this morning. Chest x-ray was repeated this morning which showed worsening bilateral infiltrates. He was subsequently transferred over to this facility for further management.   Subjective: 8/21 A/O x4, negative S OB, negative abdominal pain, negative N/V.   Assessment & Plan:   Principal Problem:   Acute respiratory disease due to COVID-19 virus Active Problems:   Acute respiratory failure with hypoxia (HCC)   Acute metabolic encephalopathy   Alcohol abuse   CAD (coronary artery disease)   Hypothyroidism   Acute respiratory failure with hypoxia/COVID 19 pneumonia  - 8/20 SNF COVID test pending - 8/20 reconsulted social work; plan of care?  SNF placement? -  Afebrile on RA. - Ambulatory SPO2 pending - Prednisone 40 mg daily aggressively titrate off; 8/20 decrease prednisone 20 mg daily - Completed course Remdesivir - Actemra never indicated - Continue COVID vitamins per  protocol -Combivent QID  Acute metabolic encephalopathy -Most likely secondary to acute illness + EtOH abuse -Resolved  Nonsustained ventricular tachycardia -Noted to have multiple episodes of nonsustained ventricular tachycardia on 8/6.  Patient remained asymptomatic.  EKG did not show any concerning findings. Potassium was supplemented.  Patient was started on beta-blocker.  No further episodes of nonsustained ventricular tachycardia in the last 2 days.  Apparently he is followed by a cardiologist in [place].  According to care everywhere he had a stress test in 2017 which showed normal systolic function at that time.   -Patient will need outpatient follow-up with cardiology. -Metoprolol 25 mg BID -Currently NSR   CAD  -He has had stent placement previously.   -Followed by cardiologist in [place].  Continue aspirin, Plavix and statin.    Macrocytic anemia  -No evidence of overt bleeding. Anemia panel reviewed.  No clear deficiencies identified.   -Continue vitamin B12.  Hemoglobin is stable  B12 deficiency -Seem macrocytic anemia  Hypothyroidism  -Synthroid 150 mcg daily  - TSH is low most likely due to sick euthyroid.  Free T4 is normal at 0.96.  EtOH abuse with withdrawal at outside hospital  -Currently no signs or symptoms of withdrawal    DVT prophylaxis: Lovenox Code Status: Full Family Communication: None Disposition Plan: Discharge on 8/22   Consultants:    Procedures/Significant Events:     I have personally reviewed and interpreted all radiology studies and my findings are as above.  VENTILATOR SETTINGS:    Cultures 8/20 SNF COVID pending    Antimicrobials: Anti-infectives (From  admission, onward)   Start     Stop   12/28/18 1800  remdesivir 100 mg in sodium chloride 0.9 % 250 mL IVPB     12/31/18 2100   12/27/18 1800  remdesivir 200 mg in sodium chloride 0.9 % 250 mL IVPB     12/27/18 2144       Devices    LINES / TUBES:       Continuous Infusions: . sodium chloride Stopped (12/28/18 1858)     Objective: Vitals:   01/12/19 0425 01/12/19 0430 01/12/19 0552 01/12/19 0813  BP: 106/70  114/72 104/76  Pulse: 64  67 69  Resp: 17   15  Temp: 97.6 F (36.4 C)   97.9 F (36.6 C)  TempSrc: Oral   Oral  SpO2: 96%  96% 96%  Weight:  89.2 kg    Height:        Intake/Output Summary (Last 24 hours) at 01/12/2019 0856 Last data filed at 01/12/2019 [id number] Gross per 24 hour  Intake 3 ml  Output 600 ml  Net -597 ml   Filed Weights   01/10/19 0435 01/11/19 0500 01/12/19 0430  Weight: 89.2 kg 89 kg 89.2 kg   Physical Exam:  General: A/O x4, no acute respiratory distress Eyes: negative scleral hemorrhage, negative anisocoria, negative icterus ENT: Negative Runny nose, negative gingival bleeding, Neck:  Negative scars, masses, torticollis, lymphadenopathy, JVD Lungs: Clear to auscultation bilaterally without wheezes or crackles Cardiovascular: Regular rate and rhythm without murmur gallop or rub normal S1 and S2 Abdomen: negative abdominal pain, nondistended, positive soft, bowel sounds, no rebound, no ascites, no appreciable mass Extremities: No significant cyanosis, clubbing, or edema bilateral lower extremities Skin: Negative rashes, lesions, ulcers Psychiatric:  Negative depression, negative anxiety, negative fatigue, negative mania  Central nervous system:  Cranial nerves II through XII intact, tongue/uvula midline, all extremities muscle strength 5/5, sensation intact throughout,  negative dysarthria, negative expressive aphasia, negative receptive aphasia.  .     Data Reviewed: Care during the described time interval was provided by me .  I have reviewed this patient's available data, including medical history, events of note, physical examination, and all test results as part of my evaluation.   CBC: Recent Labs  Lab 01/08/19 0435  WBC 12.0*  HGB 12.8*  HCT 38.1*  MCV 99.2  PLT [id number]   Basic  Metabolic Panel: Recent Labs  Lab 01/08/19 0435  NA 135  K 4.1  CL 100  CO2 26  GLUCOSE 66*  BUN 23  CREATININE 1.06  CALCIUM 8.9   GFR: Estimated Creatinine Clearance: 75.9 mL/min (by C-G formula based on SCr of 1.06 mg/dL). Liver Function Tests: No results for input(s): AST, ALT, ALKPHOS, BILITOT, PROT, ALBUMIN in the last 168 hours. No results for input(s): LIPASE, AMYLASE in the last 168 hours. No results for input(s): AMMONIA in the last 168 hours. Coagulation Profile: No results for input(s): INR, PROTIME in the last 168 hours. Cardiac Enzymes: No results for input(s): CKTOTAL, CKMB, CKMBINDEX, TROPONINI in the last 168 hours. BNP (last 3 results) No results for input(s): PROBNP in the last 8760 hours. HbA1C: No results for input(s): HGBA1C in the last 72 hours. CBG: No results for input(s): GLUCAP in the last 168 hours. Lipid Profile: No results for input(s): CHOL, HDL, LDLCALC, TRIG, CHOLHDL, LDLDIRECT in the last 72 hours. Thyroid Function Tests: No results for input(s): TSH, T4TOTAL, FREET4, T3FREE, THYROIDAB in the last 72 hours. Anemia Panel: No results for input(s): VITAMINB12, FOLATE,  FERRITIN, TIBC, IRON, RETICCTPCT in the last 72 hours. Urine analysis:    Component Value Date/Time   COLORURINE YELLOW 01/07/2014 1226   APPEARANCEUR CLEAR 01/07/2014 1226   LABSPEC 1.021 01/07/2014 1226   PHURINE 7.0 01/07/2014 1226   GLUCOSEU NEGATIVE 01/07/2014 1226   HGBUR NEGATIVE 01/07/2014 1226   BILIRUBINUR NEGATIVE 01/07/2014 1226   KETONESUR 15 (A) 01/07/2014 1226   PROTEINUR NEGATIVE 01/07/2014 1226   UROBILINOGEN 0.2 01/07/2014 1226   NITRITE NEGATIVE 01/07/2014 1226   LEUKOCYTESUR TRACE (A) 01/07/2014 1226   Sepsis Labs: @LABRCNTIP (procalcitonin:4,lacticidven:4)  ) Recent Results (from the past 240 hour(s))  SARS Coronavirus 2 [facility] order, Performed in [place] hospital lab)     Status: Abnormal   Collection Time: 01/04/19  2:00 PM  Result  Value Ref Range Status   SARS Coronavirus 2 POSITIVE (A) NEGATIVE Final    Comment: RESULT CALLED TO, READ BACK BY AND VERIFIED WITH: E.DODOO AT 2025 ON 01/04/19 BY N.THOMPSON (NOTE) If result is NEGATIVE SARS-CoV-2 target nucleic acids are NOT DETECTED. The SARS-CoV-2 RNA is generally detectable in upper and lower  respiratory specimens during the acute phase of infection. The lowest  concentration of SARS-CoV-2 viral copies this assay can detect is 250  copies / mL. A negative result does not preclude SARS-CoV-2 infection  and should not be used as the sole basis for treatment or other  patient management decisions.  A negative result may occur with  improper specimen collection / handling, submission of specimen other  than nasopharyngeal swab, presence of viral mutation(s) within the  areas targeted by this assay, and inadequate number of viral copies  (<250 copies / mL). A negative result must be combined with clinical  observations, patient history, and epidemiological information. If result is POSITIVE SARS-CoV-2 target nucleic acids are DETECTE D. The SARS-CoV-2 RNA is generally detectable in upper and lower  respiratory specimens during the acute phase of infection.  Positive  results are indicative of active infection with SARS-CoV-2.  Clinical  correlation with patient history and other diagnostic information is  necessary to determine patient infection status.  Positive results do  not rule out bacterial infection or co-infection with other viruses. If result is PRESUMPTIVE POSTIVE SARS-CoV-2 nucleic acids MAY BE PRESENT.   A presumptive positive result was obtained on the submitted specimen  and confirmed on repeat testing.  While 2019 novel coronavirus  (SARS-CoV-2) nucleic acids may be present in the submitted sample  additional confirmatory testing may be necessary for epidemiological  and / or clinical management purposes  to differentiate between  SARS-CoV-2 and  other Sarbecovirus currently known to infect humans.  If clinically indicated additional testing with an alternate test  methodology (LAB745 3) is advised. The SARS-CoV-2 RNA is generally  detectable in upper and lower respiratory specimens during the acute  phase of infection. The expected result is Negative. Fact Sheet for Patients:  StrictlyIdeas.no Fact Sheet for Healthcare Providers: BankingDealers.co.za This test is not yet approved or cleared by the [place] FDA and has been authorized for detection and/or diagnosis of SARS-CoV-2 by FDA under an Emergency Use Authorization (EUA).  This EUA will remain in effect (meaning this test can be used) for the duration of the COVID-19 declaration under Section 564(b)(1) of the Act, 21 U.S.C. section 360bbb-3(b)(1), unless the authorization is terminated or revoked sooner. Performed at [facility], [place] [address] [place], [place] [address]          Radiology Studies: No results  found.      Scheduled Meds: . aspirin EC  81 mg Oral Daily  . atorvastatin  10 mg Oral Daily  . clopidogrel  75 mg Oral Daily  . enoxaparin (LOVENOX) injection  40 mg Subcutaneous BID  . folic acid  1 mg Oral Daily  . Ipratropium-Albuterol  1 puff Inhalation Q6H  . levothyroxine  150 mcg Oral Daily  . metoprolol tartrate  25 mg Oral BID  . multivitamin with minerals  1 tablet Oral Daily  . nicotine  14 mg Transdermal Daily  . nystatin   Topical TID  . pantoprazole  40 mg Oral Daily  . predniSONE  20 mg Oral Q breakfast  . senna  1 tablet Oral BID  . sodium chloride flush  3 mL Intravenous Q12H  . thiamine  100 mg Oral Daily  . traZODone  50 mg Oral QHS  . vitamin B-12  500 mcg Oral Daily  . vitamin C  500 mg Oral Daily  . zinc sulfate  220 mg Oral Daily   Continuous Infusions: . sodium chloride Stopped (12/28/18 1858)     LOS: 16 days   The patient is critically ill  with multiple organ systems failure and requires high complexity decision making for assessment and support, frequent evaluation and titration of therapies, application of advanced monitoring technologies and extensive interpretation of multiple databases. Critical Care Time devoted to patient care services described in this note  Time spent: 40 minutes     WOODS, Geraldo Docker, MD [facility] Pager [phone]  If 7PM-7AM, please contact night-coverage [email or web address] Password [facility] 01/12/2019, 8:56 AM

## 2019-01-13 DIAGNOSIS — J9601 Acute respiratory failure with hypoxia: Secondary | ICD-10-CM | POA: Diagnosis not present

## 2019-01-13 DIAGNOSIS — E038 Other specified hypothyroidism: Secondary | ICD-10-CM | POA: Diagnosis not present

## 2019-01-13 DIAGNOSIS — I951 Orthostatic hypotension: Secondary | ICD-10-CM | POA: Diagnosis not present

## 2019-01-13 DIAGNOSIS — G9341 Metabolic encephalopathy: Secondary | ICD-10-CM | POA: Diagnosis not present

## 2019-01-13 DIAGNOSIS — I10 Essential (primary) hypertension: Secondary | ICD-10-CM | POA: Diagnosis not present

## 2019-01-13 DIAGNOSIS — I472 Ventricular tachycardia: Secondary | ICD-10-CM | POA: Diagnosis not present

## 2019-01-13 DIAGNOSIS — R278 Other lack of coordination: Secondary | ICD-10-CM | POA: Diagnosis not present

## 2019-01-13 DIAGNOSIS — R2689 Other abnormalities of gait and mobility: Secondary | ICD-10-CM | POA: Diagnosis not present

## 2019-01-13 DIAGNOSIS — R42 Dizziness and giddiness: Secondary | ICD-10-CM | POA: Diagnosis not present

## 2019-01-13 DIAGNOSIS — R2681 Unsteadiness on feet: Secondary | ICD-10-CM | POA: Diagnosis not present

## 2019-01-13 DIAGNOSIS — J1289 Other viral pneumonia: Secondary | ICD-10-CM | POA: Diagnosis not present

## 2019-01-13 DIAGNOSIS — Z03818 Encounter for observation for suspected exposure to other biological agents ruled out: Secondary | ICD-10-CM | POA: Diagnosis not present

## 2019-01-13 DIAGNOSIS — U071 COVID-19: Secondary | ICD-10-CM | POA: Diagnosis not present

## 2019-01-13 DIAGNOSIS — M6281 Muscle weakness (generalized): Secondary | ICD-10-CM | POA: Diagnosis not present

## 2019-01-13 DIAGNOSIS — J9600 Acute respiratory failure, unspecified whether with hypoxia or hypercapnia: Secondary | ICD-10-CM | POA: Diagnosis not present

## 2019-01-13 DIAGNOSIS — D539 Nutritional anemia, unspecified: Secondary | ICD-10-CM | POA: Diagnosis not present

## 2019-01-13 DIAGNOSIS — I2510 Atherosclerotic heart disease of native coronary artery without angina pectoris: Secondary | ICD-10-CM | POA: Diagnosis not present

## 2019-01-13 DIAGNOSIS — F1010 Alcohol abuse, uncomplicated: Secondary | ICD-10-CM | POA: Diagnosis not present

## 2019-01-13 LAB — CBC WITH DIFFERENTIAL/PLATELET
Abs Immature Granulocytes: 0.03 10*3/uL (ref 0.00–0.07)
Basophils Absolute: 0 10*3/uL (ref 0.0–0.1)
Basophils Relative: 0 %
Eosinophils Absolute: 0.1 10*3/uL (ref 0.0–0.5)
Eosinophils Relative: 1 %
HCT: 36.9 % — ABNORMAL LOW (ref 39.0–52.0)
Hemoglobin: 12.5 g/dL — ABNORMAL LOW (ref 13.0–17.0)
Immature Granulocytes: 0 %
Lymphocytes Relative: 27 %
Lymphs Abs: 2.4 10*3/uL (ref 0.7–4.0)
MCH: 33.5 pg (ref 26.0–34.0)
MCHC: 33.9 g/dL (ref 30.0–36.0)
MCV: 98.9 fL (ref 80.0–100.0)
Monocytes Absolute: 0.6 10*3/uL (ref 0.1–1.0)
Monocytes Relative: 7 %
Neutro Abs: 5.8 10*3/uL (ref 1.7–7.7)
Neutrophils Relative %: 65 %
Platelets: 181 10*3/uL (ref 150–400)
RBC: 3.73 MIL/uL — ABNORMAL LOW (ref 4.22–5.81)
RDW: 12.7 % (ref 11.5–15.5)
WBC: 9 10*3/uL (ref 4.0–10.5)
nRBC: 0 % (ref 0.0–0.2)

## 2019-01-13 LAB — COMPREHENSIVE METABOLIC PANEL
ALT: 41 U/L (ref 0–44)
AST: 23 U/L (ref 15–41)
Albumin: 2.9 g/dL — ABNORMAL LOW (ref 3.5–5.0)
Alkaline Phosphatase: 60 U/L (ref 38–126)
Anion gap: 8 (ref 5–15)
BUN: 20 mg/dL (ref 8–23)
CO2: 26 mmol/L (ref 22–32)
Calcium: 8.9 mg/dL (ref 8.9–10.3)
Chloride: 100 mmol/L (ref 98–111)
Creatinine, Ser: 0.94 mg/dL (ref 0.61–1.24)
GFR calc Af Amer: >60 mL/min (ref >60)
GFR calc non Af Amer: >60 mL/min (ref >60)
Glucose, Bld: 82 mg/dL (ref 70–99)
Potassium: 4 mmol/L (ref 3.5–5.1)
Sodium: 134 mmol/L — ABNORMAL LOW (ref 135–145)
Total Bilirubin: 1.4 mg/dL — ABNORMAL HIGH (ref 0.3–1.2)
Total Protein: 5.9 g/dL — ABNORMAL LOW (ref 6.5–8.1)

## 2019-01-13 LAB — MAGNESIUM: Magnesium: 2.2 mg/dL (ref 1.7–2.4)

## 2019-01-13 LAB — D-DIMER, QUANTITATIVE: D-Dimer, Quant: 3.41 ug/mL-FEU — ABNORMAL HIGH (ref 0.00–0.50)

## 2019-01-13 LAB — NOVEL CORONAVIRUS, NAA (HOSP ORDER, SEND-OUT TO REF LAB; TAT 18-24 HRS)

## 2019-01-13 LAB — C-REACTIVE PROTEIN: CRP: <0.8 mg/dL (ref <1.0)

## 2019-01-13 MED ORDER — POLYETHYLENE GLYCOL 3350 17 G PO PACK: 17.0000 g | PACK | Freq: Every day | ORAL | 0 refills | Status: AC | PRN

## 2019-01-13 MED ORDER — NICOTINE 14 MG/24HR TD PT24: 14.0000 mg | MEDICATED_PATCH | Freq: Every day | TRANSDERMAL | 0 refills | Status: AC

## 2019-01-13 MED ORDER — TRAZODONE HCL 50 MG PO TABS: 50.0000 mg | ORAL_TABLET | Freq: Every day | ORAL | 0 refills | Status: AC

## 2019-01-13 MED ORDER — ZINC SULFATE 220 (50 ZN) MG PO CAPS: 220.0000 mg | ORAL_CAPSULE | Freq: Every day | ORAL | 0 refills | Status: AC

## 2019-01-13 MED ORDER — SENNA 8.6 MG PO TABS: 1.0000 | ORAL_TABLET | Freq: Two times a day (BID) | ORAL | 0 refills | Status: AC

## 2019-01-13 MED ORDER — ASCORBIC ACID 500 MG PO TABS: 500.0000 mg | ORAL_TABLET | Freq: Every day | ORAL | 0 refills | Status: AC

## 2019-01-13 MED ORDER — LEVOTHYROXINE SODIUM 150 MCG PO TABS: 150.0000 ug | ORAL_TABLET | Freq: Every day | ORAL | 0 refills | Status: AC

## 2019-01-13 MED ORDER — ADULT MULTIVITAMIN W/MINERALS CH: 1.0000 | ORAL_TABLET | Freq: Every day | ORAL | 0 refills | Status: AC

## 2019-01-13 MED ORDER — CYANOCOBALAMIN 1000 MCG/ML IJ SOLN: 1000.0000 ug | INTRAMUSCULAR | 0 refills | Status: AC

## 2019-01-13 MED ORDER — GUAIFENESIN-DM 100-10 MG/5ML PO SYRP: 10.0000 mL | ORAL_SOLUTION | ORAL | 0 refills | Status: AC | PRN

## 2019-01-13 MED ORDER — IPRATROPIUM-ALBUTEROL 20-100 MCG/ACT IN AERS: 1.0000 | INHALATION_SPRAY | Freq: Four times a day (QID) | RESPIRATORY_TRACT | 0 refills | Status: AC | PRN

## 2019-01-13 MED ORDER — CLOPIDOGREL BISULFATE 75 MG PO TABS: 75.0000 mg | ORAL_TABLET | Freq: Every day | ORAL | 0 refills | Status: AC

## 2019-01-13 MED ORDER — ASPIRIN 81 MG PO TBEC: 81.0000 mg | DELAYED_RELEASE_TABLET | Freq: Every day | ORAL | 0 refills | Status: AC

## 2019-01-13 MED ORDER — METOPROLOL TARTRATE 25 MG PO TABS: 25.0000 mg | ORAL_TABLET | Freq: Two times a day (BID) | ORAL | 0 refills | Status: AC

## 2019-01-13 MED ORDER — PANTOPRAZOLE SODIUM 40 MG PO TBEC: 40.0000 mg | DELAYED_RELEASE_TABLET | Freq: Every day | ORAL | 0 refills | Status: AC

## 2019-01-13 NOTE — Discharge Summary (Signed)
Physician Discharge Summary  Billy Byrd [id number] DOB: 20-Apr-1941 DOA: 12/27/2018  PCP: Orpah Cobb., MD  Admit date: 12/27/2018 Discharge date: 01/13/2019  Time spent: 30 minutes  Recommendations for Outpatient Follow-up:    Acute respiratory failure with hypoxia/COVID 19 pneumonia  - 8/20 SNF COVID test pending but [facility] has excepted patient -  Afebrile on RA. - Ambulatory SPO2 did not meet criteria for home O2 -  8/22 prednisone will discontinue at discharge.  - Completed course Remdesivir - Actemra never indicated - Continue COVID vitamins per protocol -Combivent QID  Acute metabolic encephalopathy -Most likely secondary to acute illness + EtOH abuse -Resolved  Nonsustained ventricular tachycardia -Noted to have multiple episodes of nonsustained ventricular tachycardia on 8/6. Patient remained asymptomatic. EKG did not show any concerning findings. Potassium was supplemented. Patient was started on beta-blocker. No further episodes of nonsustained ventricular tachycardia in the last 2 days. Apparently he is followed by a cardiologist in [place]. According to care everywhere he had a stress test in 2017 which showed normal systolic function at that time.  -Patient will need outpatient follow-up with cardiology. -Metoprolol 25 mg BID -Currently NSR   CAD  -He has had stent placement previously.  -Followed by cardiologist in [place]. Continue aspirin, Plavix and statin.   Macrocytic anemia  -No evidence of overt bleeding. Anemia panel reviewed. No clear deficiencies identified.  -Continue vitamin B12. Hemoglobin is stable  B12 deficiency -Seem macrocytic anemia  Hypothyroidism  -Synthroid 150 mcg daily  -TSH is low most likely due to sick euthyroid. Free T4 is normal at 0.96.  EtOH abuse with withdrawal at outside hospital  -Currently no signs or symptoms of withdrawal     Discharge Diagnoses:  Principal Problem:   Acute  respiratory disease due to COVID-19 virus Active Problems:   Acute respiratory failure with hypoxia (HCC)   Acute metabolic encephalopathy   Alcohol abuse   CAD (coronary artery disease)   Hypothyroidism   Discharge Condition: Stable  Diet recommendation: Heart healthy  Filed Weights   01/11/19 0500 01/12/19 0430 01/13/19 0247  Weight: 89 kg 89.2 kg 85.9 kg    History of present illness:  78 y.o.WM PMHx coronary artery disease, status post PCI more than 2 years ago, on aspirin and Plavix, history of hypothyroidism, history of alcohol abuse, history of tobacco abuse who has been living with a sister for the past 1 month. It is not clear why he moved in with hissister but apparently has been having frequent falls and has not been able to take care of himself,presumably due to his issues with alcoholism. Patient apparently goes to a"club" where he consumes alcohol. It is thought that that iswhere he contracted COVID-19. He has been sick for about 5 to 6 days. He was taken to the hospital in [facility] on Saturday and was hospitalized. He was started on antibacterial agents. Subsequently the COVID-19 test came back as being positive. His oxygenation has worsened over the last 24 hours. He was initially on 2 L of oxygen subsequently went to 6 L and then 10 L this morning. Chest x-ray was repeated this morning which showed worsening bilateral infiltrates. He was subsequently transferred over to this facility for further management. During his hospitalization patient was treated for acute respiratory failure secondary to COVID 19 pneumonia.  Received high-dose steroids +Remdesivir, and responded well to treatment.  Patient is hospital course was complicated by nonsustained ventricular tachycardia, however once metoprolol added to patient's course of treatment  resolved.  Stable for discharge.    Cultures   8/13 SARS coronavirus positive 8/17 MRSA by PCR negative  8/20  Novel coronavirus pending    Antibiotics Anti-infectives (From admission, onward)   Start     Stop   12/28/18 1800  remdesivir 100 mg in sodium chloride 0.9 % 250 mL IVPB     12/31/18 2100   12/27/18 1800  remdesivir 200 mg in sodium chloride 0.9 % 250 mL IVPB     12/27/18 2144       Discharge Exam: Vitals:   01/13/19 0247 01/13/19 0400 01/13/19 0517 01/13/19 0718  BP:   106/72 99/74  Pulse:  60 66 69  Resp:  (!) 9 14 16   Temp:  98.2 F (36.8 C)  97.9 F (36.6 C)  TempSrc:  Oral  Oral  SpO2:  96% 97% 97%  Weight: 85.9 kg     Height:        General: A/O x4, no acute respiratory distress Eyes: negative scleral hemorrhage, negative anisocoria, negative icterus ENT: Negative Runny nose, negative gingival bleeding, Neck:  Negative scars, masses, torticollis, lymphadenopathy, JVD Lungs: Clear to auscultation bilaterally without wheezes or crackles Cardiovascular: Regular rate and rhythm without murmur gallop or rub normal S1 and S2    Discharge Instructions  Discharge Instructions    Call MD for:  difficulty breathing, headache or visual disturbances   Complete by: As directed    Call MD for:  extreme fatigue   Complete by: As directed    Call MD for:  persistant dizziness or light-headedness   Complete by: As directed    Call MD for:  persistant nausea and vomiting   Complete by: As directed    Call MD for:  temperature >100.4   Complete by: As directed    Diet - low sodium heart healthy   Complete by: As directed    Discharge instructions   Complete by: As directed    Please review instructions on the discharge summary.  COVID 19 INSTRUCTIONS  - You are felt to be stable enough to no longer require inpatient monitoring, testing, and treatment, though you will need to follow the recommendations below: - Based on the CDC's non-test criteria for ending self-isolation: You may not return to work/leave the home until at least 21 days since symptom onset AND 3 days  without a fever (without taking tylenol, ibuprofen, etc.) AND have improvement in respiratory symptoms. - Do not take NSAID medications (including, but not limited to, ibuprofen, advil, motrin, naproxen, aleve, goody's powder, etc.) - Follow up with your doctor in the next week via telehealth or seek medical attention right away if your symptoms get WORSE.  - Consider donating plasma after you have recovered (either 14 days after a negative test or 28 days after symptoms have completely resolved) because your antibodies to this virus may be helpful to give to others with life-threatening infections. Please go to the website www.oneblood.org if you would like to consider volunteering for plasma donation.    Directions for you at home:  Wear a facemask You should wear a facemask that covers your nose and mouth when you are in the same room with other people and when you visit a healthcare provider. People who live with or visit you should also wear a facemask while they are in the same room with you.  Separate yourself from other people in your home As much as possible, you should stay in a different room from other  people in your home. Also, you should use a separate bathroom, if available.  Avoid sharing household items You should not share dishes, drinking glasses, cups, eating utensils, towels, bedding, or other items with other people in your home. After using these items, you should wash them thoroughly with soap and water.  Cover your coughs and sneezes Cover your mouth and nose with a tissue when you cough or sneeze, or you can cough or sneeze into your sleeve. Throw used tissues in a lined trash can, and immediately wash your hands with soap and water for at least 20 seconds or use an alcohol-based hand rub.  Wash your [facility] your hands often and thoroughly with soap and water for at least 20 seconds. You can use an alcohol-based hand sanitizer if soap and water are not  available and if your hands are not visibly dirty. Avoid touching your eyes, nose, and mouth with unwashed hands.  Directions for those who live with, or provide care at home for you:  Limit the number of people who have contact with the patient If possible, have only one caregiver for the patient. Other household members should stay in another home or place of residence. If this is not possible, they should stay in another room, or be separated from the patient as much as possible. Use a separate bathroom, if available. Restrict visitors who do not have an essential need to be in the home.  Ensure good ventilation Make sure that shared spaces in the home have good air flow, such as from an air conditioner or an opened window, weather permitting.  Wash your hands often Wash your hands often and thoroughly with soap and water for at least 20 seconds. You can use an alcohol based hand sanitizer if soap and water are not available and if your hands are not visibly dirty. Avoid touching your eyes, nose, and mouth with unwashed hands. Use disposable paper towels to dry your hands. If not available, use dedicated cloth towels and replace them when they become wet.  Wear a facemask and gloves Wear a disposable facemask at all times in the room and gloves when you touch or have contact with the patient's blood, body fluids, and/or secretions or excretions, such as sweat, saliva, sputum, nasal mucus, vomit, urine, or feces.  Ensure the mask fits over your nose and mouth tightly, and do not touch it during use. Throw out disposable facemasks and gloves after using them. Do not reuse. Wash your hands immediately after removing your facemask and gloves. If your personal clothing becomes contaminated, carefully remove clothing and launder. Wash your hands after handling contaminated clothing. Place all used disposable facemasks, gloves, and other waste in a lined container before disposing them with  other household waste. Remove gloves and wash your hands immediately after handling these items.  Do not share dishes, glasses, or other household items with the patient Avoid sharing household items. You should not share dishes, drinking glasses, cups, eating utensils, towels, bedding, or other items with a patient who is confirmed to have, or being evaluated for, COVID-19 infection. After the person uses these items, you should wash them thoroughly with soap and water.  Wash laundry thoroughly Immediately remove and wash clothes or bedding that have blood, body fluids, and/or secretions or excretions, such as sweat, saliva, sputum, nasal mucus, vomit, urine, or feces, on them. Wear gloves when handling laundry from the patient. Read and follow directions on labels of laundry or clothing items and  detergent. In general, wash and dry with the warmest temperatures recommended on the label.  Clean all areas the individual has used often Clean all touchable surfaces, such as counters, tabletops, doorknobs, bathroom fixtures, toilets, phones, keyboards, tablets, and bedside tables, every day. Also, clean any surfaces that may have blood, body fluids, and/or secretions or excretions on them. Wear gloves when cleaning surfaces the patient has come in contact with. Use a diluted bleach solution (e.g., dilute bleach with 1 part bleach and 10 parts water) or a household disinfectant with a label that says EPA-registered for coronaviruses. To make a bleach solution at home, add 1 tablespoon of bleach to 1 quart (4 cups) of water. For a larger supply, add  cup of bleach to 1 gallon (16 cups) of water. Read labels of cleaning products and follow recommendations provided on product labels. Labels contain instructions for safe and effective use of the cleaning product including precautions you should take when applying the product, such as wearing gloves or eye protection and making sure you have good ventilation  during use of the product. Remove gloves and wash hands immediately after cleaning.  Monitor yourself for signs and symptoms of illness Caregivers and household members are considered close contacts, should monitor their health, and will be asked to limit movement outside of the home to the extent possible. Follow the monitoring steps for close contacts listed on the symptom monitoring form.   If you have additional questions, contact your local health department or call the epidemiologist on call at [phone] (available 24/7). This guidance is subject to change. For the most up-to-date guidance from [facility], please refer to their website: YouBlogs.pl   You were cared for by a hospitalist during your hospital stay. If you have any questions about your discharge medications or the care you received while you were in the hospital after you are discharged, you can call the unit and asked to speak with the hospitalist on call if the hospitalist that took care of you is not available. Once you are discharged, your primary care physician will handle any further medical issues. Please note that NO REFILLS for any discharge medications will be authorized once you are discharged, as it is imperative that you return to your primary care physician (or establish a relationship with a primary care physician if you do not have one) for your aftercare needs so that they can reassess your need for medications and monitor your lab values. If you do not have a primary care physician, you can call [phone] for a physician referral.   Increase activity slowly   Complete by: As directed      Allergies as of 01/13/2019   No Known Allergies     Medication List    STOP taking these medications   acetaminophen 500 MG tablet Commonly known as: TYLENOL   aspirin 81 MG tablet Replaced by: aspirin 81 MG EC tablet   clonazePAM 1 MG tablet Commonly known  as: KLONOPIN   esomeprazole 40 MG capsule Commonly known as: [place] Replaced by: pantoprazole 40 MG tablet   potassium chloride 10 MEQ tablet Commonly known as: K-DUR   ZEGERID OTC PO   zolpidem 10 MG tablet Commonly known as: AMBIEN     TAKE these medications   ascorbic acid 500 MG tablet Commonly known as: VITAMIN C Take 1 tablet (500 mg total) by mouth daily for 7 days.   aspirin 81 MG EC tablet Take 1 tablet (81 mg total) by mouth daily.  Start taking on: January 14, 2019 Replaces: aspirin 81 MG tablet   atorvastatin 10 MG tablet Commonly known as: LIPITOR Take 10 mg by mouth daily.   clopidogrel 75 MG tablet Commonly known as: PLAVIX Take 1 tablet (75 mg total) by mouth daily. Start taking on: January 14, 2019   cyanocobalamin 1000 MCG/ML injection Commonly known as: (VITAMIN B-12) Inject 1 mL (1,000 mcg total) into the muscle every 30 (thirty) days. First of each month   furosemide 20 MG tablet Commonly known as: LASIX Take 1 tablet (20 mg total) by mouth daily. What changed:   medication strength  how much to take   guaiFENesin-dextromethorphan 100-10 MG/5ML syrup Commonly known as: ROBITUSSIN DM Take 10 mLs by mouth every 4 (four) hours as needed for cough.   Ipratropium-Albuterol 20-100 MCG/ACT Aers respimat Commonly known as: COMBIVENT Inhale 1 puff into the lungs every 6 (six) hours as needed for wheezing or shortness of breath.   levothyroxine 150 MCG tablet Commonly known as: SYNTHROID Take 1 tablet (150 mcg total) by mouth daily. Start taking on: January 14, 2019 What changed:   when to take this  additional instructions   metoprolol tartrate 25 MG tablet Commonly known as: LOPRESSOR Take 1 tablet (25 mg total) by mouth 2 (two) times daily.   multivitamin with minerals Tabs tablet Take 1 tablet by mouth daily.   nicotine 14 mg/24hr patch Commonly known as: NICODERM CQ - dosed in mg/24 hours Place 1 patch (14 mg total) onto the skin  daily.   nitroGLYCERIN 0.4 MG SL tablet Commonly known as: NITROSTAT Place 1 tablet under the tongue every 5 (five) minutes as needed.   pantoprazole 40 MG tablet Commonly known as: PROTONIX Take 1 tablet (40 mg total) by mouth daily. Start taking on: January 14, 2019 Replaces: esomeprazole 40 MG capsule   polyethylene glycol 17 g packet Commonly known as: MIRALAX / GLYCOLAX Take 17 g by mouth daily as needed for mild constipation.   senna 8.6 MG Tabs tablet Commonly known as: SENOKOT Take 1 tablet (8.6 mg total) by mouth 2 (two) times daily.   traZODone 50 MG tablet Commonly known as: DESYREL Take 1 tablet (50 mg total) by mouth at bedtime.   zinc sulfate 220 (50 Zn) MG capsule Take 1 capsule (220 mg total) by mouth daily for 7 days.      No Known Allergies Follow-up Information    Orpah Cobb., MD. Schedule an appointment as soon as possible for a visit in 1 week(s).   Specialty: Internal Medicine Contact information: [place] [address] [phone]            The results of significant diagnostics from this hospitalization (including imaging, microbiology, ancillary and laboratory) are listed below for reference.    Significant Diagnostic Studies: Dg Chest Port 1 View  Result Date: 12/28/2018 CLINICAL DATA:  COVID-19 positivity. EXAM: PORTABLE CHEST 1 VIEW COMPARISON:  Yesterday FINDINGS: Stable low volume chest with bilateral infiltrate. No effusion or pneumothorax. Stable heart size and mediastinal contours. IMPRESSION: Low volume chest with bilateral pneumonia that is stable from yesterday. Electronically Signed   By: Monte Fantasia M.D.   On: 12/28/2018 08:57    Microbiology: Recent Results (from the past 240 hour(s))  SARS Coronavirus 2 [facility] order, Performed in [facility] hospital lab)     Status: Abnormal   Collection Time: 01/04/19  2:00 PM  Result Value Ref Range Status   SARS Coronavirus 2 POSITIVE (A)  NEGATIVE  Final    Comment: RESULT CALLED TO, READ BACK BY AND VERIFIED WITH: E.DODOO AT 2025 ON 01/04/19 BY N.THOMPSON (NOTE) If result is NEGATIVE SARS-CoV-2 target nucleic acids are NOT DETECTED. The SARS-CoV-2 RNA is generally detectable in upper and lower  respiratory specimens during the acute phase of infection. The lowest  concentration of SARS-CoV-2 viral copies this assay can detect is 250  copies / mL. A negative result does not preclude SARS-CoV-2 infection  and should not be used as the sole basis for treatment or other  patient management decisions.  A negative result may occur with  improper specimen collection / handling, submission of specimen other  than nasopharyngeal swab, presence of viral mutation(s) within the  areas targeted by this assay, and inadequate number of viral copies  (<250 copies / mL). A negative result must be combined with clinical  observations, patient history, and epidemiological information. If result is POSITIVE SARS-CoV-2 target nucleic acids are DETECTE D. The SARS-CoV-2 RNA is generally detectable in upper and lower  respiratory specimens during the acute phase of infection.  Positive  results are indicative of active infection with SARS-CoV-2.  Clinical  correlation with patient history and other diagnostic information is  necessary to determine patient infection status.  Positive results do  not rule out bacterial infection or co-infection with other viruses. If result is PRESUMPTIVE POSTIVE SARS-CoV-2 nucleic acids MAY BE PRESENT.   A presumptive positive result was obtained on the submitted specimen  and confirmed on repeat testing.  While 2019 novel coronavirus  (SARS-CoV-2) nucleic acids may be present in the submitted sample  additional confirmatory testing may be necessary for epidemiological  and / or clinical management purposes  to differentiate between  SARS-CoV-2 and other Sarbecovirus currently known to infect humans.  If clinically  indicated additional testing with an alternate test  methodology (LAB745 3) is advised. The SARS-CoV-2 RNA is generally  detectable in upper and lower respiratory specimens during the acute  phase of infection. The expected result is Negative. Fact Sheet for Patients:  StrictlyIdeas.no Fact Sheet for Healthcare Providers: BankingDealers.co.za This test is not yet approved or cleared by the [place] FDA and has been authorized for detection and/or diagnosis of SARS-CoV-2 by FDA under an Emergency Use Authorization (EUA).  This EUA will remain in effect (meaning this test can be used) for the duration of the COVID-19 declaration under Section 564(b)(1) of the Act, 21 U.S.C. section 360bbb-3(b)(1), unless the authorization is terminated or revoked sooner. Performed at [facility], [place] [address], [place], [place] [address]      Labs: Basic Metabolic Panel: Recent Labs  Lab 01/08/19 0435 01/13/19 0317  NA 135 134*  K 4.1 4.0  CL 100 100  CO2 26 26  GLUCOSE 66* 82  BUN 23 20  CREATININE 1.06 0.94  CALCIUM 8.9 8.9  MG  --  2.2   Liver Function Tests: Recent Labs  Lab 01/13/19 0317  AST 23  ALT 41  ALKPHOS 60  BILITOT 1.4*  PROT 5.9*  ALBUMIN 2.9*   No results for input(s): LIPASE, AMYLASE in the last 168 hours. No results for input(s): AMMONIA in the last 168 hours. CBC: Recent Labs  Lab 01/08/19 0435 01/13/19 0317  WBC 12.0* 9.0  NEUTROABS  --  5.8  HGB 12.8* 12.5*  HCT 38.1* 36.9*  MCV 99.2 98.9  PLT 284 181   Cardiac Enzymes: No results for input(s): CKTOTAL, CKMB, CKMBINDEX, TROPONINI in the last  168 hours. BNP: BNP (last 3 results) No results for input(s): BNP in the last 8760 hours.  ProBNP (last 3 results) No results for input(s): PROBNP in the last 8760 hours.  CBG: No results for input(s): GLUCAP in the last 168 hours.     Signed:  Dia Crawford, MD [facility] [phone] pager

## 2019-01-13 NOTE — Progress Notes (Signed)
Report given to Cabin crew at [facility].

## 2019-01-13 NOTE — Care Management (Signed)
[facility] ([phone], option 3 for weekends), arranged pickup for 2pm as requested by [employer]. Bedside RN aware.    Ricki Miller, RN BSN Case Manager  [phone]

## 2019-01-13 NOTE — Progress Notes (Signed)
Attempted to call report x2 to [facility], on second call secretary took my phone number and states she will have nurse call me.

## 2019-01-13 NOTE — TOC Transition Note (Addendum)
Transition of Care [facility]) - CM/SW Discharge Note   Patient Details  Name: Billy Byrd MRN: [id number] Date of Birth: 1941/02/04  [facility] CM/SW Contact:  Ninfa Meeker, RN Phone [phone] (working remotely) 01/13/2019, 10:12 AM   Clinical Narrative:   78 yr old gentleman, admitted and treated for COVID 19. Patient is from [place], [place] and will be staying in [place] at [facility] for [employer], with plan to return to [place]. Case. Case manager notified his brother, Latham Saint, of scheduled discharge and transport. Alvester Chou is very appreciative of all the care his brother has received at [facility]. Case manager spoke with Bedside Nurse,Linda and informed her that [facility] would like patient transported at 2pm, she will call report to [phone], his room is 807 P. Case manager will call [facility] and arrange for transport. Medical necessity form and face sheet have been printed to the nurse's station.      Final next level of care: Skilled Nursing Facility Barriers to Discharge: No Barriers Identified   Patient Goals and CMS Choice   CMS Medicare.gov Compare Post Acute Care list provided to:: Patient Represenative (must comment)(Sister) Choice offered to / list presented to : Sibling(Sister [place])  Discharge Placement              Patient chooses bed at: [facility] Patient to be transferred to facility by: [place] Name of family member notified: Brysten Defer [id number] Patient and family notified of of transfer: 01/13/19  Discharge Plan and Services   Discharge Planning Services: CM Consult Post Acute Care Choice: Home Health          DME Arranged: N/A         HH Arranged: NA [place] Agency: [place]) Date [facility] Agency Contacted: 12/30/18 Time [place]: 1222 Representative spoke with at [place]: [place] ([place]) Interventions     Readmission  Risk Interventions No flowsheet data found.

## 2019-01-13 NOTE — Progress Notes (Signed)
Telephone call to patient's brother, Bretten Schneeberger, updated on plan of care and answered all questions. Advised that patient being d/ced to [facility] today at 1400hrs.

## 2019-01-15 DIAGNOSIS — J1289 Other viral pneumonia: Secondary | ICD-10-CM | POA: Diagnosis not present

## 2019-01-15 DIAGNOSIS — J9600 Acute respiratory failure, unspecified whether with hypoxia or hypercapnia: Secondary | ICD-10-CM | POA: Diagnosis not present

## 2019-01-15 DIAGNOSIS — U071 COVID-19: Secondary | ICD-10-CM | POA: Diagnosis not present

## 2019-01-15 DIAGNOSIS — D539 Nutritional anemia, unspecified: Secondary | ICD-10-CM | POA: Diagnosis not present

## 2019-01-16 DIAGNOSIS — J1289 Other viral pneumonia: Secondary | ICD-10-CM | POA: Diagnosis not present

## 2019-01-16 DIAGNOSIS — U071 COVID-19: Secondary | ICD-10-CM | POA: Diagnosis not present

## 2019-01-16 DIAGNOSIS — J9600 Acute respiratory failure, unspecified whether with hypoxia or hypercapnia: Secondary | ICD-10-CM | POA: Diagnosis not present

## 2019-01-16 DIAGNOSIS — G9341 Metabolic encephalopathy: Secondary | ICD-10-CM | POA: Diagnosis not present

## 2019-01-18 ENCOUNTER — Other Ambulatory Visit: Payer: Self-pay | Admitting: *Deleted

## 2019-01-18 NOTE — Patient Outreach (Signed)
Member assessed for potential [facility] Care Management needs as a benefit of  [place] Medicare.  Member is currently receiving rehab therapy at [facility].  Member discussed in weekly telephonic IDT meeting with facility staff, [facility] UM team, and writer.  Facility reports Mr. Biers is COVID-19 positive and is in isolation currently. Disposition plan is to return home however.  Writer will continue to follow for potential [facility] Care Management needs.  Marthenia Rolling, MSN-Ed, RN,BSN [place] Acute Care Coordinator [phone] [facility]) [phone]  (Toll free office)

## 2019-01-25 ENCOUNTER — Other Ambulatory Visit: Payer: Self-pay | Admitting: *Deleted

## 2019-01-25 DIAGNOSIS — R42 Dizziness and giddiness: Secondary | ICD-10-CM | POA: Diagnosis not present

## 2019-01-25 DIAGNOSIS — J9600 Acute respiratory failure, unspecified whether with hypoxia or hypercapnia: Secondary | ICD-10-CM | POA: Diagnosis not present

## 2019-01-25 DIAGNOSIS — I472 Ventricular tachycardia: Secondary | ICD-10-CM | POA: Diagnosis not present

## 2019-01-25 DIAGNOSIS — U071 COVID-19: Secondary | ICD-10-CM | POA: Diagnosis not present

## 2019-01-25 NOTE — Patient Outreach (Signed)
Member assessed for potential [facility] Care Management needs as a benefit of  [place] [employer].  Member is currently receiving rehab therapy at [facility].  Member discussed in weekly telephonic IDT meeting with facility staff, [facility] UM team, and writer.  Facility reports member has had new issues with dizziness and hypotension. Reports this is new for Mr. Strommer since he has been at [facility]. Blood pressure medications are being adjusted.   Writer will continue to follow for progression, disposition plans, and for potential [facility] Care Management services.   Marthenia Rolling, MSN-Ed, RN,BSN [place] Acute Care Coordinator [phone] [facility]) [phone]  (Toll free office)

## 2019-01-31 DIAGNOSIS — I472 Ventricular tachycardia: Secondary | ICD-10-CM | POA: Diagnosis not present

## 2019-01-31 DIAGNOSIS — I951 Orthostatic hypotension: Secondary | ICD-10-CM | POA: Diagnosis not present

## 2019-01-31 DIAGNOSIS — J9600 Acute respiratory failure, unspecified whether with hypoxia or hypercapnia: Secondary | ICD-10-CM | POA: Diagnosis not present

## 2019-01-31 DIAGNOSIS — I2510 Atherosclerotic heart disease of native coronary artery without angina pectoris: Secondary | ICD-10-CM | POA: Diagnosis not present

## 2019-02-02 ENCOUNTER — Other Ambulatory Visit: Payer: Self-pay | Admitting: *Deleted

## 2019-02-02 DIAGNOSIS — I10 Essential (primary) hypertension: Secondary | ICD-10-CM | POA: Diagnosis not present

## 2019-02-02 DIAGNOSIS — I951 Orthostatic hypotension: Secondary | ICD-10-CM | POA: Diagnosis not present

## 2019-02-02 DIAGNOSIS — E038 Other specified hypothyroidism: Secondary | ICD-10-CM | POA: Diagnosis not present

## 2019-02-02 DIAGNOSIS — I472 Ventricular tachycardia: Secondary | ICD-10-CM | POA: Diagnosis not present

## 2019-02-02 DIAGNOSIS — J9600 Acute respiratory failure, unspecified whether with hypoxia or hypercapnia: Secondary | ICD-10-CM | POA: Diagnosis not present

## 2019-02-02 DIAGNOSIS — G9341 Metabolic encephalopathy: Secondary | ICD-10-CM | POA: Diagnosis not present

## 2019-02-02 DIAGNOSIS — I2510 Atherosclerotic heart disease of native coronary artery without angina pectoris: Secondary | ICD-10-CM | POA: Diagnosis not present

## 2019-02-02 DIAGNOSIS — U071 COVID-19: Secondary | ICD-10-CM | POA: Diagnosis not present

## 2019-02-02 NOTE — Patient Outreach (Signed)
Member assessed for potential [facility] Care Management needs as a benefit of  [place] [employer].  Member is currently receiving rehab therapy at [facility].  Member discussed in weekly telephonic IDT meeting with facility staff, [facility] UM team, and writer on yesterday.  Facility reported member will likely dc home on 02/04/19. It was not clear on whether member will return home alone or to his sister's home.   Outreach attempt made today to speak with Mr. Lumley at [phone] about [place] Management services. No answer. HIPAA compliant voicemail message left.   Will continue to follow for disposition plans and date. Will plan outreach attempt again and make referral to [place] post SNF discharge if needed.     Marthenia Rolling, MSN-Ed, RN,BSN [place] Acute Care Coordinator [phone] [facility]) [phone]  (Toll free office)

## 2019-02-03 DIAGNOSIS — Z03818 Encounter for observation for suspected exposure to other biological agents ruled out: Secondary | ICD-10-CM | POA: Diagnosis not present

## 2019-02-05 ENCOUNTER — Other Ambulatory Visit: Payer: Self-pay | Admitting: *Deleted

## 2019-02-05 NOTE — Patient Outreach (Signed)
Verified in Patient [place] that Mr. Gaetani discharged to home on 02/03/19.  Telephone call made to Mr. Artiaga at [phone] to discuss [place] Management follow up.  Patient identifiers confirmed. Mr. Castles denies having any [facility] Care Management needs at this time. Expressed appreciation of writer's call.  Will plan to sign off since [place] Management services were declined at this time.  Will make [facility] UM RN aware.    Marthenia Rolling, MSN-Ed, RN,BSN [place] Acute Care Coordinator [phone] [facility]) [phone]  (Toll free office)

## 2019-03-07 DIAGNOSIS — R410 Disorientation, unspecified: Secondary | ICD-10-CM | POA: Diagnosis not present

## 2019-03-07 DIAGNOSIS — Z5329 Procedure and treatment not carried out because of patient's decision for other reasons: Secondary | ICD-10-CM | POA: Diagnosis not present

## 2019-03-07 DIAGNOSIS — F0390 Unspecified dementia without behavioral disturbance: Secondary | ICD-10-CM | POA: Diagnosis not present

## 2019-03-07 DIAGNOSIS — I10 Essential (primary) hypertension: Secondary | ICD-10-CM | POA: Diagnosis not present

## 2019-03-07 DIAGNOSIS — R4182 Altered mental status, unspecified: Secondary | ICD-10-CM | POA: Diagnosis not present

## 2019-03-07 DIAGNOSIS — R609 Edema, unspecified: Secondary | ICD-10-CM | POA: Diagnosis not present

## 2019-03-07 DIAGNOSIS — F17210 Nicotine dependence, cigarettes, uncomplicated: Secondary | ICD-10-CM | POA: Diagnosis not present

## 2019-03-08 DIAGNOSIS — Z03818 Encounter for observation for suspected exposure to other biological agents ruled out: Secondary | ICD-10-CM | POA: Diagnosis not present

## 2019-03-14 DIAGNOSIS — T162XXA Foreign body in left ear, initial encounter: Secondary | ICD-10-CM | POA: Diagnosis not present

## 2019-03-14 DIAGNOSIS — G4700 Insomnia, unspecified: Secondary | ICD-10-CM | POA: Diagnosis not present

## 2019-03-14 DIAGNOSIS — F17210 Nicotine dependence, cigarettes, uncomplicated: Secondary | ICD-10-CM | POA: Diagnosis not present

## 2019-03-14 DIAGNOSIS — F419 Anxiety disorder, unspecified: Secondary | ICD-10-CM | POA: Diagnosis not present

## 2019-03-14 DIAGNOSIS — R600 Localized edema: Secondary | ICD-10-CM | POA: Diagnosis not present

## 2019-03-14 DIAGNOSIS — E538 Deficiency of other specified B group vitamins: Secondary | ICD-10-CM | POA: Diagnosis not present

## 2019-03-14 DIAGNOSIS — E039 Hypothyroidism, unspecified: Secondary | ICD-10-CM | POA: Diagnosis not present

## 2019-03-14 DIAGNOSIS — H9193 Unspecified hearing loss, bilateral: Secondary | ICD-10-CM | POA: Diagnosis not present

## 2019-03-14 DIAGNOSIS — I2510 Atherosclerotic heart disease of native coronary artery without angina pectoris: Secondary | ICD-10-CM | POA: Diagnosis not present

## 2019-04-03 DIAGNOSIS — Z23 Encounter for immunization: Secondary | ICD-10-CM | POA: Diagnosis not present

## 2019-04-10 DIAGNOSIS — H903 Sensorineural hearing loss, bilateral: Secondary | ICD-10-CM | POA: Diagnosis not present

## 2019-04-10 DIAGNOSIS — T162XXA Foreign body in left ear, initial encounter: Secondary | ICD-10-CM | POA: Diagnosis not present

## 2019-04-23 DIAGNOSIS — I058 Other rheumatic mitral valve diseases: Secondary | ICD-10-CM | POA: Diagnosis not present

## 2019-05-03 DIAGNOSIS — R0602 Shortness of breath: Secondary | ICD-10-CM | POA: Diagnosis not present

## 2022-05-24 DEATH — deceased
